# Patient Record
Sex: Female | Born: 1980 | Race: White | Hispanic: No | Smoking: Current every day smoker
Health system: Southern US, Community
[De-identification: ages and names within clinical notes are randomized; demographics above are authoritative.]

## PROBLEM LIST (undated history)

## (undated) DIAGNOSIS — F419 Anxiety disorder, unspecified: Secondary | ICD-10-CM

## (undated) DIAGNOSIS — G8929 Other chronic pain: Secondary | ICD-10-CM

## (undated) DIAGNOSIS — F1110 Opioid abuse, uncomplicated: Secondary | ICD-10-CM

## (undated) DIAGNOSIS — B1920 Unspecified viral hepatitis C without hepatic coma: Secondary | ICD-10-CM

## (undated) DIAGNOSIS — M549 Dorsalgia, unspecified: Secondary | ICD-10-CM

## (undated) HISTORY — PX: TUBAL LIGATION: SHX77

## (undated) HISTORY — PX: TONSILLECTOMY: SUR1361

## (undated) HISTORY — PX: FOOT SURGERY: SHX648

---

## 2000-02-14 ENCOUNTER — Emergency Department (HOSPITAL_COMMUNITY): Admission: EM | Admit: 2000-02-14 | Discharge: 2000-02-14 | Payer: Self-pay | Admitting: Emergency Medicine

## 2007-12-05 ENCOUNTER — Emergency Department (HOSPITAL_COMMUNITY): Admission: EM | Admit: 2007-12-05 | Discharge: 2007-12-05 | Payer: Self-pay | Admitting: Emergency Medicine

## 2007-12-28 ENCOUNTER — Emergency Department (HOSPITAL_COMMUNITY): Admission: EM | Admit: 2007-12-28 | Discharge: 2007-12-28 | Payer: Self-pay | Admitting: Emergency Medicine

## 2008-03-24 ENCOUNTER — Emergency Department (HOSPITAL_COMMUNITY): Admission: EM | Admit: 2008-03-24 | Discharge: 2008-03-24 | Payer: Self-pay | Admitting: Emergency Medicine

## 2008-03-30 ENCOUNTER — Emergency Department (HOSPITAL_COMMUNITY): Admission: EM | Admit: 2008-03-30 | Discharge: 2008-03-30 | Payer: Self-pay | Admitting: Emergency Medicine

## 2008-05-26 ENCOUNTER — Emergency Department (HOSPITAL_COMMUNITY): Admission: EM | Admit: 2008-05-26 | Discharge: 2008-05-26 | Payer: Self-pay | Admitting: Emergency Medicine

## 2008-05-31 ENCOUNTER — Emergency Department (HOSPITAL_COMMUNITY): Admission: EM | Admit: 2008-05-31 | Discharge: 2008-05-31 | Payer: Self-pay | Admitting: Emergency Medicine

## 2008-06-02 ENCOUNTER — Emergency Department (HOSPITAL_COMMUNITY): Admission: EM | Admit: 2008-06-02 | Discharge: 2008-06-02 | Payer: Self-pay | Admitting: Emergency Medicine

## 2008-07-24 ENCOUNTER — Emergency Department (HOSPITAL_COMMUNITY): Admission: EM | Admit: 2008-07-24 | Discharge: 2008-07-24 | Payer: Self-pay | Admitting: Emergency Medicine

## 2008-11-16 ENCOUNTER — Emergency Department (HOSPITAL_COMMUNITY): Admission: EM | Admit: 2008-11-16 | Discharge: 2008-11-16 | Payer: Self-pay | Admitting: Emergency Medicine

## 2009-03-10 ENCOUNTER — Ambulatory Visit: Payer: Self-pay | Admitting: Psychiatry

## 2009-03-10 ENCOUNTER — Inpatient Hospital Stay (HOSPITAL_COMMUNITY): Admission: AD | Admit: 2009-03-10 | Discharge: 2009-03-14 | Payer: Self-pay | Admitting: Psychiatry

## 2009-06-05 ENCOUNTER — Emergency Department (HOSPITAL_COMMUNITY): Admission: EM | Admit: 2009-06-05 | Discharge: 2009-06-06 | Payer: Self-pay | Admitting: Emergency Medicine

## 2009-12-24 ENCOUNTER — Emergency Department (HOSPITAL_BASED_OUTPATIENT_CLINIC_OR_DEPARTMENT_OTHER): Admission: EM | Admit: 2009-12-24 | Discharge: 2009-12-24 | Payer: Self-pay | Admitting: Emergency Medicine

## 2010-06-12 ENCOUNTER — Emergency Department (HOSPITAL_BASED_OUTPATIENT_CLINIC_OR_DEPARTMENT_OTHER): Admission: EM | Admit: 2010-06-12 | Discharge: 2010-06-12 | Payer: Self-pay | Admitting: Emergency Medicine

## 2010-08-12 ENCOUNTER — Emergency Department (HOSPITAL_BASED_OUTPATIENT_CLINIC_OR_DEPARTMENT_OTHER): Admission: EM | Admit: 2010-08-12 | Discharge: 2010-08-12 | Payer: Self-pay | Admitting: Emergency Medicine

## 2010-08-26 ENCOUNTER — Emergency Department (HOSPITAL_BASED_OUTPATIENT_CLINIC_OR_DEPARTMENT_OTHER): Admission: EM | Admit: 2010-08-26 | Discharge: 2010-08-26 | Payer: Self-pay | Admitting: Emergency Medicine

## 2010-10-08 ENCOUNTER — Emergency Department (HOSPITAL_BASED_OUTPATIENT_CLINIC_OR_DEPARTMENT_OTHER): Admission: EM | Admit: 2010-10-08 | Discharge: 2010-10-08 | Payer: Self-pay | Admitting: Emergency Medicine

## 2010-10-13 ENCOUNTER — Emergency Department (HOSPITAL_BASED_OUTPATIENT_CLINIC_OR_DEPARTMENT_OTHER): Admission: EM | Admit: 2010-10-13 | Discharge: 2010-10-13 | Payer: Self-pay | Admitting: Emergency Medicine

## 2010-10-14 ENCOUNTER — Emergency Department (HOSPITAL_BASED_OUTPATIENT_CLINIC_OR_DEPARTMENT_OTHER): Admission: EM | Admit: 2010-10-14 | Discharge: 2010-10-14 | Payer: Self-pay | Admitting: Emergency Medicine

## 2010-11-19 ENCOUNTER — Emergency Department (HOSPITAL_BASED_OUTPATIENT_CLINIC_OR_DEPARTMENT_OTHER)
Admission: EM | Admit: 2010-11-19 | Discharge: 2010-11-19 | Payer: Self-pay | Source: Home / Self Care | Admitting: Emergency Medicine

## 2010-11-23 ENCOUNTER — Emergency Department (HOSPITAL_BASED_OUTPATIENT_CLINIC_OR_DEPARTMENT_OTHER)
Admission: EM | Admit: 2010-11-23 | Discharge: 2010-11-23 | Payer: Self-pay | Source: Home / Self Care | Admitting: Emergency Medicine

## 2010-11-26 ENCOUNTER — Emergency Department (HOSPITAL_BASED_OUTPATIENT_CLINIC_OR_DEPARTMENT_OTHER)
Admission: EM | Admit: 2010-11-26 | Discharge: 2010-11-26 | Payer: Self-pay | Source: Home / Self Care | Admitting: Emergency Medicine

## 2010-11-27 LAB — WOUND CULTURE: Gram Stain: NONE SEEN

## 2010-12-02 ENCOUNTER — Emergency Department (HOSPITAL_BASED_OUTPATIENT_CLINIC_OR_DEPARTMENT_OTHER)
Admission: EM | Admit: 2010-12-02 | Discharge: 2010-12-02 | Payer: Self-pay | Source: Home / Self Care | Admitting: Emergency Medicine

## 2011-01-27 ENCOUNTER — Emergency Department (HOSPITAL_BASED_OUTPATIENT_CLINIC_OR_DEPARTMENT_OTHER)
Admission: EM | Admit: 2011-01-27 | Discharge: 2011-01-27 | Disposition: A | Discharge status: Home / Self Care | Payer: Self-pay | Attending: Emergency Medicine | Admitting: Emergency Medicine

## 2011-01-27 ENCOUNTER — Emergency Department (INDEPENDENT_AMBULATORY_CARE_PROVIDER_SITE_OTHER): Payer: Self-pay

## 2011-01-27 DIAGNOSIS — N390 Urinary tract infection, site not specified: Secondary | ICD-10-CM | POA: Insufficient documentation

## 2011-01-27 DIAGNOSIS — R109 Unspecified abdominal pain: Secondary | ICD-10-CM

## 2011-01-27 DIAGNOSIS — R300 Dysuria: Secondary | ICD-10-CM | POA: Insufficient documentation

## 2011-01-27 LAB — URINALYSIS, ROUTINE W REFLEX MICROSCOPIC
Bilirubin Urine: NEGATIVE
Glucose, UA: NEGATIVE mg/dL
Ketones, ur: NEGATIVE mg/dL
Leukocytes, UA: NEGATIVE
Nitrite: NEGATIVE
Protein, ur: NEGATIVE mg/dL
Specific Gravity, Urine: 1.012 (ref 1.005–1.030)
Urobilinogen, UA: 0.2 mg/dL (ref 0.0–1.0)
pH: 6 (ref 5.0–8.0)

## 2011-01-27 LAB — URINE MICROSCOPIC-ADD ON

## 2011-01-27 LAB — PREGNANCY, URINE: Preg Test, Ur: NEGATIVE

## 2011-02-18 LAB — POCT I-STAT, CHEM 8
BUN: 8 mg/dL (ref 6–23)
Calcium, Ion: 1.13 mmol/L (ref 1.12–1.32)
Chloride: 101 mEq/L (ref 96–112)
Creatinine, Ser: 0.8 mg/dL (ref 0.4–1.2)
Glucose, Bld: 91 mg/dL (ref 70–99)
HCT: 42 % (ref 36.0–46.0)
Hemoglobin: 14.3 g/dL (ref 12.0–15.0)
Potassium: 4.2 mEq/L (ref 3.5–5.1)
Sodium: 140 mEq/L (ref 135–145)
TCO2: 30 mmol/L (ref 0–100)

## 2011-02-18 LAB — RAPID URINE DRUG SCREEN, HOSP PERFORMED
Amphetamines: NOT DETECTED
Barbiturates: NOT DETECTED
Benzodiazepines: POSITIVE — AB
Cocaine: NOT DETECTED
Opiates: POSITIVE — AB
Tetrahydrocannabinol: POSITIVE — AB

## 2011-02-18 LAB — ETHANOL: Alcohol, Ethyl (B): <5 mg/dL (ref 0–10)

## 2011-02-25 ENCOUNTER — Emergency Department (HOSPITAL_BASED_OUTPATIENT_CLINIC_OR_DEPARTMENT_OTHER)
Admission: EM | Admit: 2011-02-25 | Discharge: 2011-02-25 | Discharge status: Against Medical Advice | Payer: Self-pay | Attending: Emergency Medicine | Admitting: Emergency Medicine

## 2011-02-26 LAB — BASIC METABOLIC PANEL
BUN: 10 mg/dL (ref 6–23)
CO2: 28 mEq/L (ref 19–32)
Calcium: 9.4 mg/dL (ref 8.4–10.5)
Chloride: 103 mEq/L (ref 96–112)
Creatinine, Ser: 0.75 mg/dL (ref 0.4–1.2)
GFR calc Af Amer: >60 mL/min (ref >60)
GFR calc non Af Amer: >60 mL/min (ref >60)
Glucose, Bld: 124 mg/dL — ABNORMAL HIGH (ref 70–99)
Potassium: 3.2 mEq/L — ABNORMAL LOW (ref 3.5–5.1)
Sodium: 137 mEq/L (ref 135–145)

## 2011-02-26 LAB — CBC
HCT: 43 % (ref 36.0–46.0)
Hemoglobin: 14.5 g/dL (ref 12.0–15.0)
MCHC: 33.7 g/dL (ref 30.0–36.0)
MCV: 91.1 fL (ref 78.0–100.0)
Platelets: 331 10*3/uL (ref 150–400)
RBC: 4.73 MIL/uL (ref 3.87–5.11)
RDW: 14.3 % (ref 11.5–15.5)
WBC: 8.5 10*3/uL (ref 4.0–10.5)

## 2011-02-26 LAB — DIFFERENTIAL
Basophils Absolute: 0.1 10*3/uL (ref 0.0–0.1)
Basophils Relative: 2 % — ABNORMAL HIGH (ref 0–1)
Eosinophils Absolute: 0.1 10*3/uL (ref 0.0–0.7)
Eosinophils Relative: 2 % (ref 0–5)
Lymphocytes Relative: 35 % (ref 12–46)
Lymphs Abs: 3 10*3/uL (ref 0.7–4.0)
Monocytes Absolute: 0.5 10*3/uL (ref 0.1–1.0)
Monocytes Relative: 7 % (ref 3–12)
Neutro Abs: 4.7 10*3/uL (ref 1.7–7.7)
Neutrophils Relative %: 55 % (ref 43–77)

## 2011-02-26 LAB — RAPID URINE DRUG SCREEN, HOSP PERFORMED
Amphetamines: NOT DETECTED
Barbiturates: POSITIVE — AB
Benzodiazepines: POSITIVE — AB
Cocaine: NOT DETECTED
Opiates: NOT DETECTED
Tetrahydrocannabinol: POSITIVE — AB

## 2011-02-26 LAB — ETHANOL: Alcohol, Ethyl (B): <5 mg/dL (ref 0–10)

## 2011-03-03 ENCOUNTER — Emergency Department (HOSPITAL_BASED_OUTPATIENT_CLINIC_OR_DEPARTMENT_OTHER)
Admission: EM | Admit: 2011-03-03 | Discharge: 2011-03-03 | Disposition: A | Discharge status: Home / Self Care | Payer: Self-pay | Attending: Emergency Medicine | Admitting: Emergency Medicine

## 2011-03-03 ENCOUNTER — Emergency Department (INDEPENDENT_AMBULATORY_CARE_PROVIDER_SITE_OTHER): Payer: Self-pay

## 2011-03-03 DIAGNOSIS — F17200 Nicotine dependence, unspecified, uncomplicated: Secondary | ICD-10-CM | POA: Insufficient documentation

## 2011-03-03 DIAGNOSIS — M545 Low back pain: Secondary | ICD-10-CM

## 2011-03-03 DIAGNOSIS — G8929 Other chronic pain: Secondary | ICD-10-CM | POA: Insufficient documentation

## 2011-03-03 DIAGNOSIS — Z79899 Other long term (current) drug therapy: Secondary | ICD-10-CM | POA: Insufficient documentation

## 2011-03-03 DIAGNOSIS — M549 Dorsalgia, unspecified: Secondary | ICD-10-CM | POA: Insufficient documentation

## 2011-03-03 DIAGNOSIS — W010XXA Fall on same level from slipping, tripping and stumbling without subsequent striking against object, initial encounter: Secondary | ICD-10-CM

## 2011-03-03 DIAGNOSIS — F341 Dysthymic disorder: Secondary | ICD-10-CM | POA: Insufficient documentation
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
## 2011-03-27 NOTE — Discharge Summary (Signed)
Bridget Morgan, Bridget Morgan                ACCOUNT NO.:  [id number]   MEDICAL RECORD NO.:  [id number]          PATIENT TYPE:  IPS   LOCATION:  0305                          [facility]ACILITY:  [facility]   PHYSICIAN:  Jasmine Pang, M.D. DATE OF BIRTH:  1981-06-25   DATE OF ADMISSION:  03/10/2009  DATE OF DISCHARGE:  03/14/2009                               DISCHARGE SUMMARY   IDENTIFYING INFORMATION:  This is a 30 year old single female who was  admitted on a voluntary basis on March 10, 2009.   HISTORY OF PRESENT ILLNESS:  This is the first [facility] admission for this 28-  year-old with a history of substance abuse since age 37.  She presented  to [place] by way of [facility] Emergency Room.  She reports passive  suicidal thoughts and fearing that she cannot get over her addiction.  She states she feels she wants to and is unable to stop using opiates  and benzodiazepines.  She has also been drinking regularly.  She feels  helpless to overcome her addiction and wishes she was dead.  For further  admission information, see psychiatric admission assessment.   PHYSICAL FINDINGS:  Physical exam was done in the [facility]  Emergency Room and is noted in the record.   LABORATORY DATA:  Chemistries were unremarkable.  Liver enzymes normal.  CBC within normal limits.  Alcohol level less than 0.01.  Urine drug  screen positive for benzodiazepines, marijuana, and tricyclics.   HOSPITAL COURSE:  The patient was started on Ambien 5 mg p.o. q.h.s.  p.r.n. insomnia.  She was also started on Robaxin 500 mg p.o. q.i.d.  p.r.n. muscle spasms, and Librium 25 mg p.o. q.4 h. p.r.n. symptoms of  withdrawal and a Nicoderm patch 21 mg daily.  On March 11, 2009, she was  started on Librium detox protocol and clonidine detox protocol.  She was  started on Seroquel 25 mg q.4 h. p.r.n. agitation and Prozac 40 mg  daily, and Ensure q.i.d. p.r.n.  On Mar 12, 2009, the Prozac was  discontinued and she was started on Celexa 20 mg  p.o. q.h.s. and  trazodone 100 mg p.o. q.h.s., may repeat x1 p.r.n.  In individual  sessions, the patient was a well-developed, well-nourished female who  was alert and oriented x4.  She was sad, tearful, and hopeless.  She was  nonpsychotic.  There was no thought disorder.  No active suicidal  ideation.  She stated she wanted help.  She stated she has a sponsor and  knows what to do, just needed to do it.  She was detoxing appropriately  with no symptoms of withdrawal or side effects.  She was asking for  something for her joint irritation.  She had Naproxen along the  clonidine detox protocol and was advised to request this, which she did.  On Mar 14, 2009, her mental status had improved markedly from admission  status.  The patient was still having some trouble sleeping, good  appetite, was improving.  Mood was less depressed, less anxious.  Affect  consistent with mood.  There was no suicidal  or homicidal ideation.  No  thoughts of self-injurious behavior.  No auditory or visual  hallucinations.  No paranoia or delusions.  Thoughts were logical and  goal-directed.  Thought content, no predominant theme.  Cognitive was  grossly intact.  Insight good.  Judgment good.  Impulse control good.  She wanted to go home today and it was felt safe for discharge.  She  plans to see her sponsor and has meeting scheduled.   DISCHARGE DIAGNOSES:  Axis I: Depressive disorder, not otherwise  specified.  Polysubstance abuse.  Axis II: None.  Axis III: Chronic back pain.  Axis IV: Severe (issues with homelessness, parenting, unemployment  issues with social environment, burden of chronic pain, burden of  chemical dependence illness and depression.)  Axis V: Global assessment of functioning was 50 upon discharge.  GAF was  40 upon admission.  GAF highest past year was 55.   DISCHARGE PLANS:  There was no specific activity level or dietary  restriction.   POSTHOSPITAL CARE PLANS:  The patient will go  to [facility] in [place] on  Wednesday Mar 16, 2009, at 9 o'clock a.m.   DISCHARGE MEDICATIONS:  1. Trazodone 100 mg at bedtime.  2. Robaxin as directed by her family doctor.  3. Celexa 20 mg p.o. q.h.s.      Jasmine Pang, M.D.  Electronically Signed     BHS/MEDQ  D:  03/14/2009  T:  03/15/2009  Job:  [id number]

## 2011-04-19 ENCOUNTER — Emergency Department (HOSPITAL_BASED_OUTPATIENT_CLINIC_OR_DEPARTMENT_OTHER)
Admission: EM | Admit: 2011-04-19 | Discharge: 2011-04-19 | Disposition: A | Discharge status: Home / Self Care | Payer: Self-pay | Attending: Emergency Medicine | Admitting: Emergency Medicine

## 2011-04-19 ENCOUNTER — Emergency Department (INDEPENDENT_AMBULATORY_CARE_PROVIDER_SITE_OTHER): Payer: Self-pay

## 2011-04-19 DIAGNOSIS — M25579 Pain in unspecified ankle and joints of unspecified foot: Secondary | ICD-10-CM

## 2011-04-19 DIAGNOSIS — M549 Dorsalgia, unspecified: Secondary | ICD-10-CM | POA: Insufficient documentation

## 2011-04-19 DIAGNOSIS — F17200 Nicotine dependence, unspecified, uncomplicated: Secondary | ICD-10-CM | POA: Insufficient documentation

## 2011-04-19 DIAGNOSIS — M545 Low back pain: Secondary | ICD-10-CM

## 2011-04-19 DIAGNOSIS — G8929 Other chronic pain: Secondary | ICD-10-CM | POA: Insufficient documentation

## 2011-04-24 ENCOUNTER — Emergency Department (HOSPITAL_BASED_OUTPATIENT_CLINIC_OR_DEPARTMENT_OTHER)
Admission: EM | Admit: 2011-04-24 | Discharge: 2011-04-24 | Disposition: A | Discharge status: Home / Self Care | Payer: Self-pay | Attending: Emergency Medicine | Admitting: Emergency Medicine

## 2011-04-24 DIAGNOSIS — M549 Dorsalgia, unspecified: Secondary | ICD-10-CM | POA: Insufficient documentation

## 2011-04-24 DIAGNOSIS — F341 Dysthymic disorder: Secondary | ICD-10-CM | POA: Insufficient documentation

## 2011-04-24 DIAGNOSIS — F17200 Nicotine dependence, unspecified, uncomplicated: Secondary | ICD-10-CM | POA: Insufficient documentation

## 2011-04-24 DIAGNOSIS — G8929 Other chronic pain: Secondary | ICD-10-CM | POA: Insufficient documentation

## 2011-05-15 ENCOUNTER — Ambulatory Visit
Admission: RE | Admit: 2011-05-15 | Discharge: 2011-05-15 | Disposition: A | Discharge status: Home / Self Care | Payer: Self-pay | Source: Ambulatory Visit | Attending: Emergency Medicine | Admitting: Emergency Medicine

## 2011-05-15 ENCOUNTER — Other Ambulatory Visit: Payer: Self-pay | Admitting: Emergency Medicine

## 2011-05-15 ENCOUNTER — Emergency Department (HOSPITAL_BASED_OUTPATIENT_CLINIC_OR_DEPARTMENT_OTHER)
Admission: EM | Admit: 2011-05-15 | Discharge: 2011-05-15 | Disposition: A | Discharge status: Home / Self Care | Payer: Self-pay | Attending: Emergency Medicine | Admitting: Emergency Medicine

## 2011-05-15 DIAGNOSIS — N644 Mastodynia: Secondary | ICD-10-CM | POA: Insufficient documentation

## 2011-05-15 DIAGNOSIS — Z79899 Other long term (current) drug therapy: Secondary | ICD-10-CM | POA: Insufficient documentation

## 2011-05-15 DIAGNOSIS — G8929 Other chronic pain: Secondary | ICD-10-CM | POA: Insufficient documentation

## 2011-05-15 DIAGNOSIS — N6452 Nipple discharge: Secondary | ICD-10-CM

## 2011-05-15 DIAGNOSIS — H7090 Unspecified mastoiditis, unspecified ear: Secondary | ICD-10-CM | POA: Insufficient documentation

## 2011-05-15 LAB — PREGNANCY, URINE: Preg Test, Ur: NEGATIVE

## 2011-05-18 LAB — WOUND CULTURE
Culture: NO GROWTH
Gram Stain: NONE SEEN

## 2011-08-03 LAB — URINALYSIS, ROUTINE W REFLEX MICROSCOPIC
Bilirubin Urine: NEGATIVE
Glucose, UA: NEGATIVE
Hgb urine dipstick: NEGATIVE
Ketones, ur: NEGATIVE
Nitrite: NEGATIVE
Protein, ur: NEGATIVE
Specific Gravity, Urine: 1.025
Urobilinogen, UA: 0.2
pH: 6

## 2011-08-03 LAB — PREGNANCY, URINE: Preg Test, Ur: NEGATIVE

## 2011-08-10 LAB — URINALYSIS, ROUTINE W REFLEX MICROSCOPIC
Bilirubin Urine: NEGATIVE
Bilirubin Urine: NEGATIVE
Glucose, UA: NEGATIVE
Glucose, UA: NEGATIVE
Hgb urine dipstick: NEGATIVE
Hgb urine dipstick: NEGATIVE
Ketones, ur: NEGATIVE
Ketones, ur: NEGATIVE
Nitrite: NEGATIVE
Nitrite: NEGATIVE
Protein, ur: NEGATIVE
Protein, ur: NEGATIVE
Specific Gravity, Urine: 1.015
Specific Gravity, Urine: 1.015
Urobilinogen, UA: 0.2
Urobilinogen, UA: 0.2
pH: 5
pH: 6

## 2011-08-10 LAB — PREGNANCY, URINE: Preg Test, Ur: NEGATIVE

## 2011-11-30 ENCOUNTER — Encounter (HOSPITAL_BASED_OUTPATIENT_CLINIC_OR_DEPARTMENT_OTHER): Payer: Self-pay | Admitting: *Deleted

## 2011-11-30 ENCOUNTER — Emergency Department (HOSPITAL_BASED_OUTPATIENT_CLINIC_OR_DEPARTMENT_OTHER)
Admission: EM | Admit: 2011-11-30 | Discharge: 2011-11-30 | Disposition: A | Discharge status: Home / Self Care | Payer: Self-pay | Attending: Emergency Medicine | Admitting: Emergency Medicine

## 2011-11-30 DIAGNOSIS — Z79899 Other long term (current) drug therapy: Secondary | ICD-10-CM | POA: Insufficient documentation

## 2011-11-30 DIAGNOSIS — M25519 Pain in unspecified shoulder: Secondary | ICD-10-CM | POA: Insufficient documentation

## 2011-11-30 DIAGNOSIS — G8929 Other chronic pain: Secondary | ICD-10-CM | POA: Insufficient documentation

## 2011-11-30 DIAGNOSIS — F17200 Nicotine dependence, unspecified, uncomplicated: Secondary | ICD-10-CM | POA: Insufficient documentation

## 2011-11-30 HISTORY — DX: Dorsalgia, unspecified: M54.9

## 2011-11-30 HISTORY — DX: Anxiety disorder, unspecified: F41.9

## 2011-11-30 HISTORY — DX: Other chronic pain: G89.29

## 2011-11-30 MED ORDER — CARISOPRODOL 350 MG PO TABS: 350.0000 mg | ORAL_TABLET | Freq: Three times a day (TID) | ORAL | Status: AC

## 2011-11-30 MED ORDER — KETOROLAC TROMETHAMINE 60 MG/2ML IM SOLN
60.0000 mg | Freq: Once | INTRAMUSCULAR | Status: AC
  Administered 2011-11-30: 60 mg via INTRAMUSCULAR
  Filled 2011-11-30: qty 2

## 2011-11-30 NOTE — ED Notes (Signed)
Right shoulder pain. No injury.

## 2011-11-30 NOTE — ED Provider Notes (Signed)
History     CSN: [id number]  Arrival date & time 11/30/11  1555   First MD Initiated Contact with Patient 11/30/11 1610      Chief Complaint  Patient presents with  . Shoulder Pain    (Consider location/radiation/quality/duration/timing/severity/associated sxs/prior treatment) HPI Patient with long history of neck and back pain- states worse now for several days.  She states her pmd, Dr. Ethelene Hal, wants her to go to pain clinic.  She states she couldn't afford to go due to lack of insurance.  Patient has been told her problems stem from her scoliosis.  Pain is in right trapezius and goes down right side of back.  Patient states pain sometimes controlled by hydrocodone, denies having had pt.  No recent injury although may have slept on it wrong.  Pain is achy sharp and agony increases with movement and is burning.   Past Medical History  Diagnosis Date  . Chronic back pain   . Anxiety     History reviewed. No pertinent past surgical history.  No family history on file.  History  Substance Use Topics  . Smoking status: Current Everyday Smoker -- 1.0 packs/day  . Smokeless tobacco: Not on file  . Alcohol Use: No    OB History    Grav Para Term Preterm Abortions TAB SAB Ect Mult Living                  Review of Systems  All other systems reviewed and are negative.    Allergies  Dilaudid and Aspirin  Home Medications   Current Outpatient Rx  Name Route Sig Dispense Refill  . VITAMIN D 1000 UNITS PO TABS Oral Take 1,000 Units by mouth daily.    . DULOXETINE HCL 30 MG PO CPEP Oral Take 30 mg by mouth daily.    Marland Kitchen FLUOXETINE HCL 40 MG PO CAPS Oral Take 40 mg by mouth daily.    Marland Kitchen HYDROCODONE-ACETAMINOPHEN 7.5-325 MG PO TABS Oral Take 1 tablet by mouth 4 (four) times daily.    . ADULT MULTIVITAMIN W/MINERALS CH Oral Take 1 tablet by mouth daily.    Marland Kitchen VITAMIN C 500 MG PO TABS Oral Take 500 mg by mouth daily.    Marland Kitchen VITAMIN E 400 UNITS PO CAPS Oral Take 400 Units by mouth  daily.      BP 140/72  Pulse 88  Temp(Src) 98.2 F (36.8 C) (Oral)  Resp 20  SpO2 100%  Physical Exam  Nursing note and vitals reviewed. Constitutional: She appears well-developed and well-nourished.  HENT:  Head: Normocephalic and atraumatic.  Right Ear: External ear normal.  Left Ear: External ear normal.  Eyes: Conjunctivae and EOM are normal. Pupils are equal, round, and reactive to light.  Neck: Normal range of motion. Neck supple.  Cardiovascular: Normal rate and regular rhythm.   Pulmonary/Chest: Effort normal and breath sounds normal.  Abdominal: Soft.  Musculoskeletal: Normal range of motion.       Diffuse trapezius tenderness,     ED Course  Procedures (including critical care time)  Labs Reviewed - No data to display No results found.   No diagnosis found.    MDM          Hilario Quarry, MD 11/30/11 [phone]

## 2012-02-26 ENCOUNTER — Emergency Department (HOSPITAL_BASED_OUTPATIENT_CLINIC_OR_DEPARTMENT_OTHER)
Admission: EM | Admit: 2012-02-26 | Discharge: 2012-02-26 | Disposition: A | Discharge status: Home / Self Care | Payer: Self-pay | Attending: Emergency Medicine | Admitting: Emergency Medicine

## 2012-02-26 ENCOUNTER — Encounter (HOSPITAL_BASED_OUTPATIENT_CLINIC_OR_DEPARTMENT_OTHER): Payer: Self-pay | Admitting: *Deleted

## 2012-02-26 DIAGNOSIS — F17200 Nicotine dependence, unspecified, uncomplicated: Secondary | ICD-10-CM | POA: Insufficient documentation

## 2012-02-26 DIAGNOSIS — M549 Dorsalgia, unspecified: Secondary | ICD-10-CM

## 2012-02-26 DIAGNOSIS — G8929 Other chronic pain: Secondary | ICD-10-CM | POA: Insufficient documentation

## 2012-02-26 DIAGNOSIS — Z886 Allergy status to analgesic agent status: Secondary | ICD-10-CM | POA: Insufficient documentation

## 2012-02-26 DIAGNOSIS — F411 Generalized anxiety disorder: Secondary | ICD-10-CM | POA: Insufficient documentation

## 2012-02-26 MED ORDER — CARISOPRODOL 350 MG PO TABS: 350.0000 mg | ORAL_TABLET | Freq: Three times a day (TID) | ORAL | Status: AC

## 2012-02-26 NOTE — Discharge Instructions (Signed)
Back Pain, Adult Low back pain is very common. About 1 in 5 people have back pain.The cause of low back pain is rarely dangerous. The pain often gets better over time.About half of people with a sudden onset of back pain feel better in just 2 weeks. About 8 in 10 people feel better by 6 weeks.  CAUSES Some common causes of back pain include:  Strain of the muscles or ligaments supporting the spine.   Wear and tear (degeneration) of the spinal discs.   Arthritis.   Direct injury to the back.  DIAGNOSIS Most of the time, the direct cause of low back pain is not known.However, back pain can be treated effectively even when the exact cause of the pain is unknown.Answering your caregiver's questions about your overall health and symptoms is one of the most accurate ways to make sure the cause of your pain is not dangerous. If your caregiver needs more information, he or she may order lab work or imaging tests (X-rays or MRIs).However, even if imaging tests show changes in your back, this usually does not require surgery. HOME CARE INSTRUCTIONS For many people, back pain returns.Since low back pain is rarely dangerous, it is often a condition that people can learn to manageon their own.   Remain active. It is stressful on the back to sit or stand in one place. Do not sit, drive, or stand in one place for more than 30 minutes at a time. Take short walks on level surfaces as soon as pain allows.Try to increase the length of time you walk each day.   Do not stay in bed.Resting more than 1 or 2 days can delay your recovery.   Do not avoid exercise or work.Your body is made to move.It is not dangerous to be active, even though your back may hurt.Your back will likely heal faster if you return to being active before your pain is gone.   Pay attention to your body when you bend and lift. Many people have less discomfortwhen lifting if they bend their knees, keep the load close to their  bodies,and avoid twisting. Often, the most comfortable positions are those that put less stress on your recovering back.   Find a comfortable position to sleep. Use a firm mattress and lie on your side with your knees slightly bent. If you lie on your back, put a pillow under your knees.   Only take over-the-counter or prescription medicines as directed by your caregiver. Over-the-counter medicines to reduce pain and inflammation are often the most helpful.Your caregiver may prescribe muscle relaxant drugs.These medicines help dull your pain so you can more quickly return to your normal activities and healthy exercise.   Put ice on the injured area.   Put ice in a plastic bag.   Place a towel between your skin and the bag.   Leave the ice on for 15 to 20 minutes, 3 to 4 times a day for the first 2 to 3 days. After that, ice and heat may be alternated to reduce pain and spasms.   Ask your caregiver about trying back exercises and gentle massage. This may be of some benefit.   Avoid feeling anxious or stressed.Stress increases muscle tension and can worsen back pain.It is important to recognize when you are anxious or stressed and learn ways to manage it.Exercise is a great option.  SEEK MEDICAL CARE IF:  You have pain that is not relieved with rest or medicine.   You have   pain that does not improve in 1 week.   You have new symptoms.   You are generally not feeling well.  SEEK IMMEDIATE MEDICAL CARE IF:   You have pain that radiates from your back into your legs.   You develop new bowel or bladder control problems.   You have unusual weakness or numbness in your arms or legs.   You develop nausea or vomiting.   You develop abdominal pain.   You feel faint.  Document Released: 10/29/2005 Document Revised: 10/18/2011 Document Reviewed: 03/19/2011 Exit[employer] Patient Information 2012 [employer]. 

## 2012-02-26 NOTE — ED Provider Notes (Signed)
History     CSN: [id number]  Arrival date & time 02/26/12  2043   First MD Initiated Contact with Patient 02/26/12 2127      Chief Complaint  Patient presents with  . Back Pain    (Consider location/radiation/quality/duration/timing/severity/associated sxs/prior treatment) Patient is a 31 y.o. female presenting with back pain. The history is provided by the patient. No language interpreter was used.  Back Pain  This is a new problem. The current episode started 12 to 24 hours ago. The problem occurs constantly. The problem has not changed since onset.The pain is associated with lifting heavy objects. The pain is present in the lumbar spine. The quality of the pain is described as stabbing. The pain does not radiate. The symptoms are aggravated by bending and twisting. The pain is the same all the time. Stiffness is present all day. She has tried nothing for the symptoms. The treatment provided moderate relief.  Pt reports she lifted a generator and has back pain.  No fall.  Pt has chronic pain.  (Pt is well known to me. Records reviewed)  I have informed pt I will not give her narcotics.    Past Medical History  Diagnosis Date  . Chronic back pain   . Anxiety     History reviewed. No pertinent past surgical history.  History reviewed. No pertinent family history.  History  Substance Use Topics  . Smoking status: Current Everyday Smoker -- 1.0 packs/day  . Smokeless tobacco: Not on file  . Alcohol Use: No    OB History    Grav Para Term Preterm Abortions TAB SAB Ect Mult Living                  Review of Systems  Musculoskeletal: Positive for back pain.  All other systems reviewed and are negative.    Allergies  Dilaudid and Aspirin  Home Medications   Current Outpatient Rx  Name Route Sig Dispense Refill  . CARISOPRODOL 350 MG PO TABS Oral Take 350 mg by mouth 3 (three) times daily as needed. For muscle spasms    . VITAMIN D 1000 UNITS PO TABS Oral Take 1,000  Units by mouth daily.    Marland Kitchen FLUOXETINE HCL 40 MG PO CAPS Oral Take 40 mg by mouth daily.    Marland Kitchen HYDROCODONE-ACETAMINOPHEN 7.5-325 MG PO TABS Oral Take 1 tablet by mouth 4 (four) times daily as needed.     . ADULT MULTIVITAMIN W/MINERALS CH Oral Take 1 tablet by mouth daily.    Marland Kitchen OVER THE COUNTER MEDICATION Oral Take 2 tablets by mouth daily as needed. Cold and sinus medication    . VITAMIN C 500 MG PO TABS Oral Take 500 mg by mouth daily.    Marland Kitchen VITAMIN E 400 UNITS PO CAPS Oral Take 400 Units by mouth daily.    Marland Kitchen CARISOPRODOL 350 MG PO TABS Oral Take 1 tablet (350 mg total) by mouth 3 (three) times daily. 15 tablet 0    BP 140/88  Pulse 93  Temp(Src) 97.8 F (36.6 C) (Oral)  Resp 18  SpO2 100%  LMP 02/25/2012  Physical Exam  Nursing note and vitals reviewed. Constitutional: She appears well-developed.  HENT:  Head: Normocephalic.  Eyes: Conjunctivae are normal. Pupils are equal, round, and reactive to light.  Neck: Normal range of motion.  Cardiovascular: Normal rate.   Pulmonary/Chest: Effort normal.  Abdominal: Soft.  Musculoskeletal: Normal range of motion.  Neurological: She is alert.  Skin: Skin is warm.  ED Course  Procedures (including critical care time)  Labs Reviewed - No data to display No results found.   No diagnosis found.    MDM  I advised pt she must see her primary MD for any narcotics.  Charge nurse present during history and physical.         Elson Areas, [place] 02/26/12 2150
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
## 2012-02-26 NOTE — ED Notes (Signed)
Pt c/o of back pain in her lower back.  Reports that she has chronic back pain and is trying to get disability for it.  The back pain got worse after she lifted the a generator a few times.  She states that she know she's not suppose to lift heavy things but sometimes she "forgets." Pt can move all extremities and no visible deformities present.

## 2012-02-26 NOTE — ED Notes (Signed)
PA at bedside.

## 2012-02-26 NOTE — ED Notes (Signed)
This RN was present during PA's assessment of pt and the explanation given to her regarding her abuse of narcotics and multiple visits to different EDs. Pt offered other forms of treatment for her pain and discussed further plan for treatment.
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
## 2012-02-27 NOTE — ED Provider Notes (Signed)
Medical screening examination/treatment/procedure(s) were performed by non-physician practitioner and as supervising physician I was immediately available for consultation/collaboration.  Amaury Kuzel, MD 02/27/12 2005 

## 2012-06-21 ENCOUNTER — Encounter (HOSPITAL_BASED_OUTPATIENT_CLINIC_OR_DEPARTMENT_OTHER): Payer: Self-pay | Admitting: *Deleted

## 2012-06-21 ENCOUNTER — Emergency Department (HOSPITAL_BASED_OUTPATIENT_CLINIC_OR_DEPARTMENT_OTHER)
Admission: EM | Admit: 2012-06-21 | Discharge: 2012-06-22 | Disposition: A | Discharge status: Home / Self Care | Payer: Self-pay | Attending: Emergency Medicine | Admitting: Emergency Medicine

## 2012-06-21 ENCOUNTER — Emergency Department (HOSPITAL_BASED_OUTPATIENT_CLINIC_OR_DEPARTMENT_OTHER): Payer: Self-pay

## 2012-06-21 DIAGNOSIS — F17200 Nicotine dependence, unspecified, uncomplicated: Secondary | ICD-10-CM | POA: Insufficient documentation

## 2012-06-21 DIAGNOSIS — M25579 Pain in unspecified ankle and joints of unspecified foot: Secondary | ICD-10-CM | POA: Insufficient documentation

## 2012-06-21 DIAGNOSIS — M25572 Pain in left ankle and joints of left foot: Secondary | ICD-10-CM

## 2012-06-21 DIAGNOSIS — G8929 Other chronic pain: Secondary | ICD-10-CM | POA: Insufficient documentation

## 2012-06-21 DIAGNOSIS — M549 Dorsalgia, unspecified: Secondary | ICD-10-CM | POA: Insufficient documentation

## 2012-06-21 DIAGNOSIS — F411 Generalized anxiety disorder: Secondary | ICD-10-CM | POA: Insufficient documentation

## 2012-06-21 MED ORDER — HYDROCODONE-ACETAMINOPHEN 5-325 MG PO TABS
1.0000 | ORAL_TABLET | Freq: Once | ORAL | Status: AC
  Administered 2012-06-21: 1 via ORAL
  Filled 2012-06-21: qty 1

## 2012-06-21 NOTE — ED Notes (Signed)
Pt states that she is supposed to be on 7.5 mgs of hydrocodone  But she can not afford to have her pain meds refilled

## 2012-06-21 NOTE — ED Provider Notes (Signed)
History     CSN: [id number]  Arrival date & time 06/21/12  2009   First MD Initiated Contact with Patient 06/21/12 2143      Chief Complaint  Patient presents with  . Foot Pain    (Consider location/radiation/quality/duration/timing/severity/associated sxs/prior treatment) HPI Comments: Patient reports she was stepping off the porch and her left ankle hyperextended under her.  Reports pain in her ankle and heel with radiation into her lateral lower leg.  Denies hitting head or LOC.  Denies other injury.  Denies weakness or numbness of the foot.    The history is provided by the patient.    Past Medical History  Diagnosis Date  . Chronic back pain   . Anxiety     History reviewed. No pertinent past surgical history.  History reviewed. No pertinent family history.  History  Substance Use Topics  . Smoking status: Current Everyday Smoker -- 1.0 packs/day  . Smokeless tobacco: Not on file  . Alcohol Use: No    OB History    Grav Para Term Preterm Abortions TAB SAB Ect Mult Living                  Review of Systems  Skin: Negative for color change, pallor and wound.  Neurological: Negative for syncope, weakness and numbness.    Allergies  Dilaudid and Aspirin  Home Medications   Current Outpatient Rx  Name Route Sig Dispense Refill  . ACETAMINOPHEN 500 MG PO TABS Oral Take 1,000 mg by mouth every 6 (six) hours as needed. For pain    . FLUOXETINE HCL 20 MG PO CAPS Oral Take 20 mg by mouth daily.    . IBUPROFEN 200 MG PO TABS Oral Take 200 mg by mouth every 6 (six) hours as needed. For pain    . OMEPRAZOLE MAGNESIUM 20 MG PO TBEC Oral Take 20 mg by mouth daily.    Marland Kitchen OVER THE COUNTER MEDICATION Oral Take 2 tablets by mouth daily as needed. Cold and sinus medication    . TRAZODONE HCL 50 MG PO TABS Oral Take 150 mg by mouth at bedtime.      BP 141/90  Pulse 106  Temp 98.1 F (36.7 C) (Oral)  Resp 24  SpO2 100%  Physical Exam  Nursing note and vitals  reviewed. Constitutional: She appears well-developed and well-nourished. No distress.  HENT:  Head: Normocephalic and atraumatic.  Neck: Neck supple.  Pulmonary/Chest: Effort normal.  Musculoskeletal:       Left ankle: She exhibits normal range of motion, no swelling, no ecchymosis, no deformity, no laceration and normal pulse. Achilles tendon normal.       Left foot: Normal.       Feet:       Left ankle with tenderness to palpation.  No edema, skin changes.  Sensation intact.  Dorsalis pedis and posterior tibialis pulses are intact.  Capillary refill < 2 seconds.    Neurological: She is alert.  Skin: She is not diaphoretic.    ED Course  Procedures (including critical care time)  Labs Reviewed - No data to display Dg Ankle Complete Left  06/22/2012  *RADIOLOGY REPORT*  Clinical Data: Twisted ankle  LEFT ANKLE COMPLETE - 3+ VIEW  Comparison: None.  Findings: The ankle mortise intact.  Talar dome is normal.  No malleolar fracture.  Calcaneus is normal.  The screw fixation of the first metatarsal noted.  IMPRESSION: No ankle fracture.  Original Report Authenticated By: Genevive Bi, M.D.  1. Pain in left ankle       MDM  Pt with pain in lower left ankle after stepping awkwardly off the porch.  There is no swelling, no discoloration.  Xrays are negative.  Pt upset that I would not refill her Norco 7.5s she uses for her chronic back pain because she has not been able to see her doctor.  I have explained to her that I will treat her for her ankle pain but that she will need to see her PCP regarding her chronic pain.  Discussed all results with patient. Pt placed in ASO and given crutches. Pt verbalizes understanding. Pt given return precautions.         [place], [place] 06/22/12 2119

## 2012-06-21 NOTE — ED Notes (Signed)
Pt states that she fell off the porch and twisted her left foot states that pain radiates up her leg. Pt initially stated that it was her right foot and then changed to left foot. Pt also states that she has a driver and that he has not left he just stepped out for a min

## 2012-06-21 NOTE — ED Notes (Signed)
Patient offered that she has a driver.  Reports that her step father brought her.

## 2012-06-22 MED ORDER — HYDROCODONE-ACETAMINOPHEN 5-325 MG PO TABS: 1.0000 | ORAL_TABLET | Freq: Four times a day (QID) | ORAL | Status: AC | PRN

## 2012-06-23 NOTE — ED Provider Notes (Signed)
Medical screening examination/treatment/procedure(s) were performed by non-physician practitioner and as supervising physician I was immediately available for consultation/collaboration.   Shardae Kleinman, MD 06/23/12 0112 

## 2012-07-07 IMAGING — CT CT ABD-PELV W/O CM
2 of 4 series · 17 of 46 positions shown, 19 images · non-contrast
Comparison: None.

CLINICAL DATA: Right flank pain

CT ABDOMEN AND PELVIS WITHOUT CONTRAST
TECHNIQUE: Multidetector CT imaging of the abdomen and pelvis was
performed following the standard protocol without intravenous
contrast.

[Series 2: renal stone < 200 lbs 5.0 b31f · axial · 0.69mm/px · z∈[+755,+1205]mm · 14 of 98 slices shown, 16 images]
[im 4/98  soft-tissue]
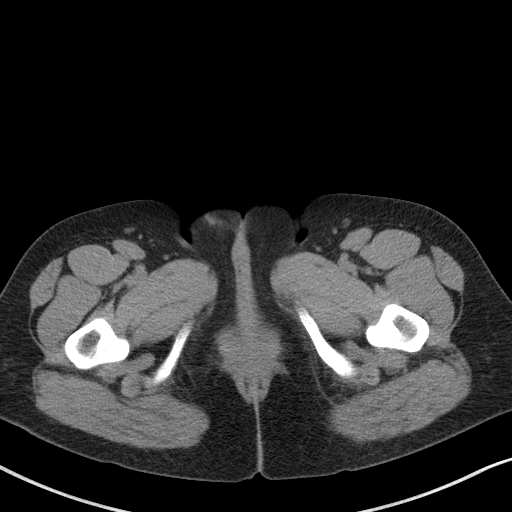
[im 4/98  bone]
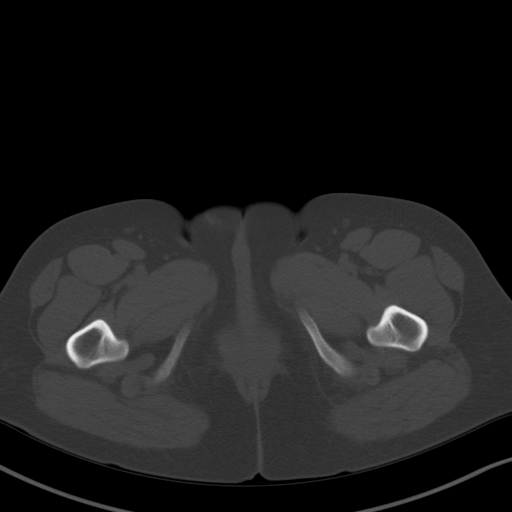
[im 12/98  soft-tissue]
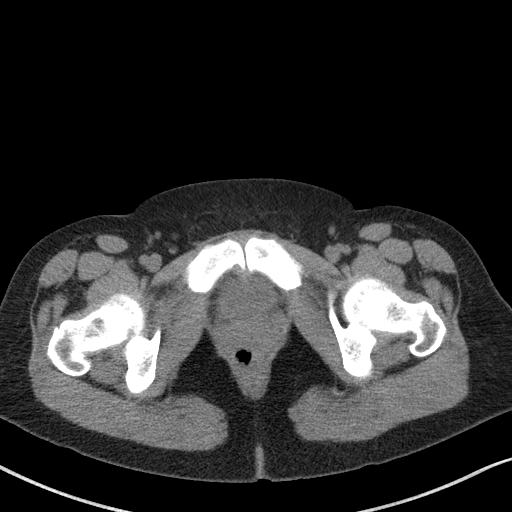
[im 20/98  soft-tissue]
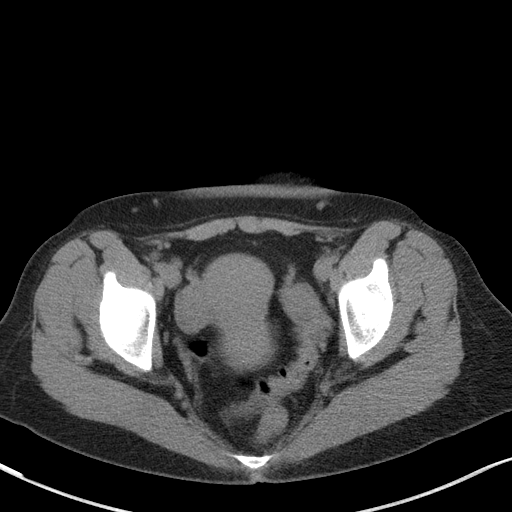
[im 28/98  soft-tissue]
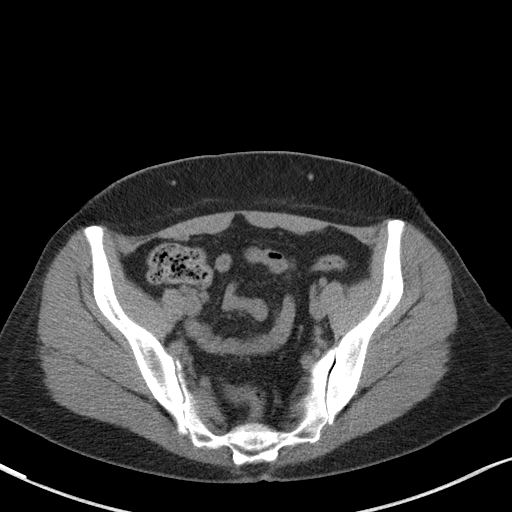
[im 32/98  soft-tissue]
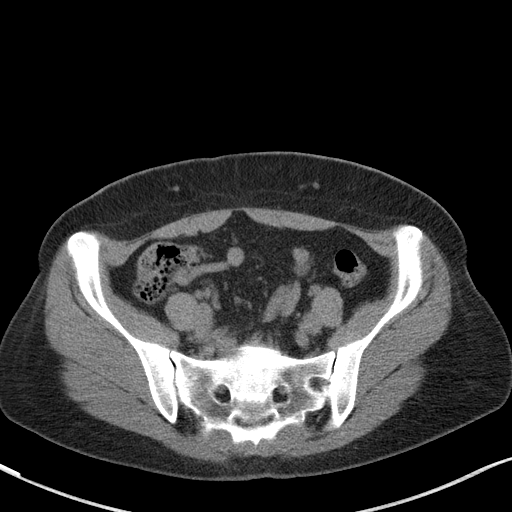
[im 39/98  soft-tissue]
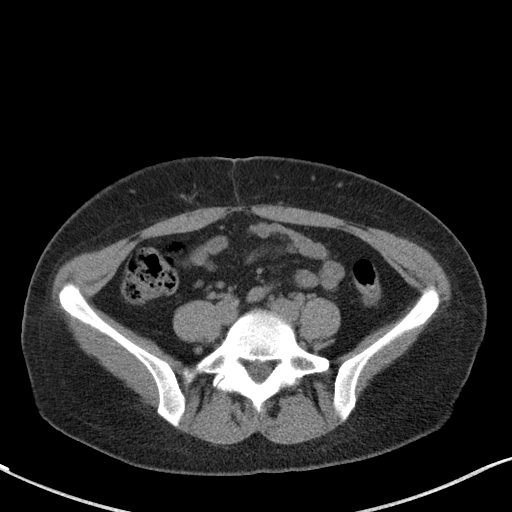
[im 47/98  soft-tissue]
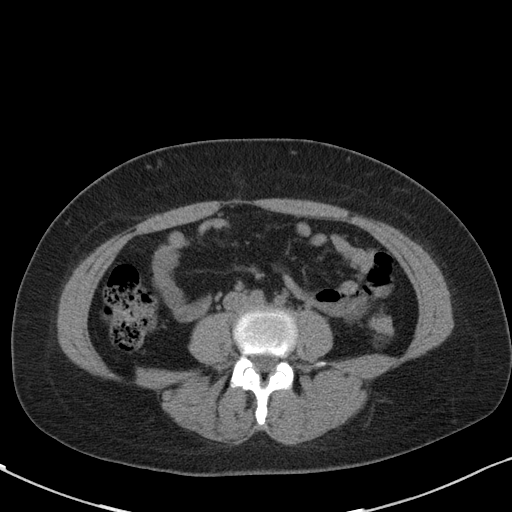
[im 51/98  soft-tissue]
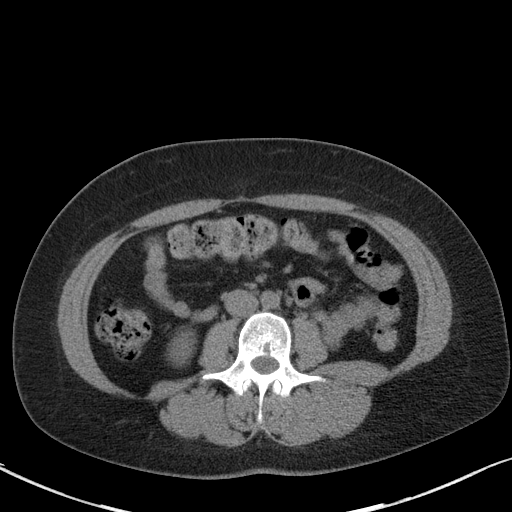
[im 59/98  soft-tissue]
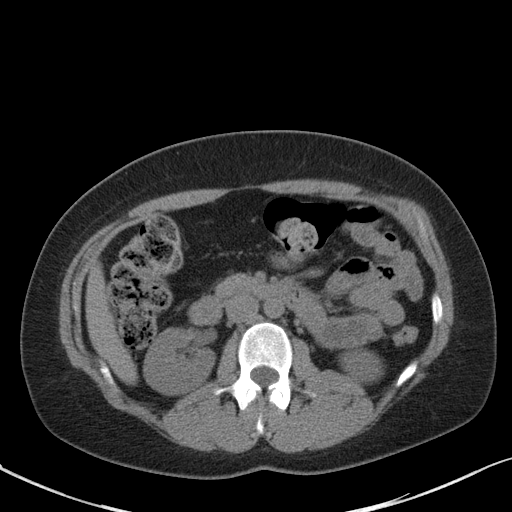
[im 59/98  bone]
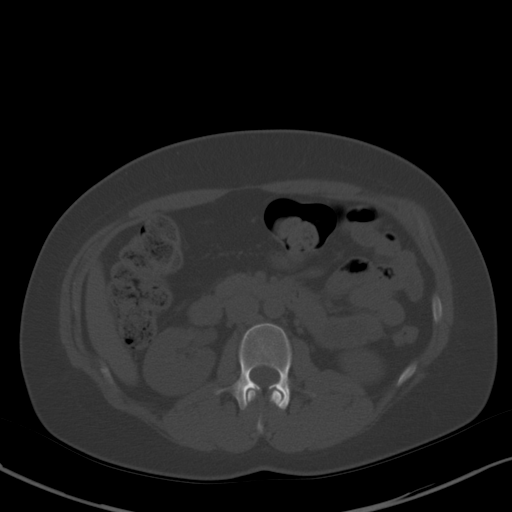
[im 66/98  soft-tissue]
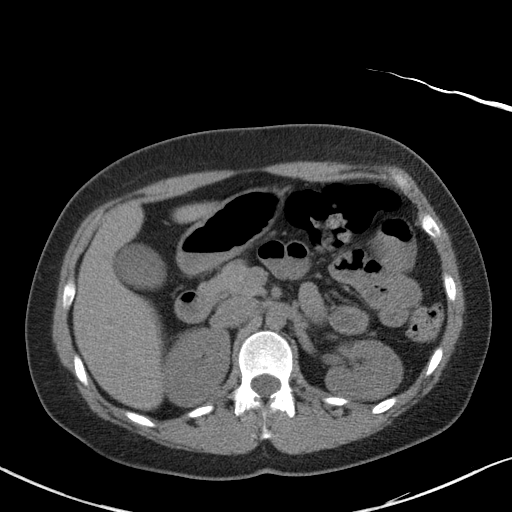
[im 74/98  soft-tissue]
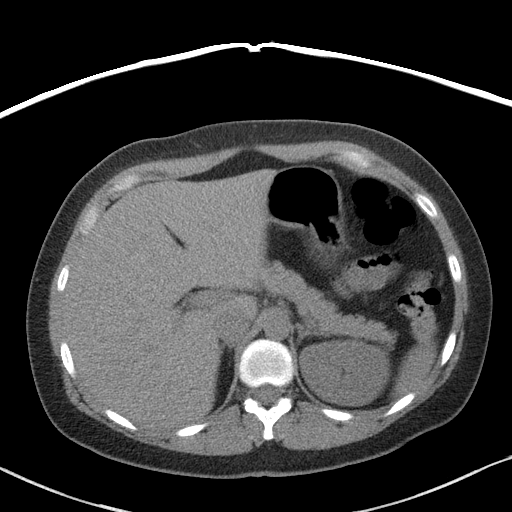
[im 78/98  soft-tissue]
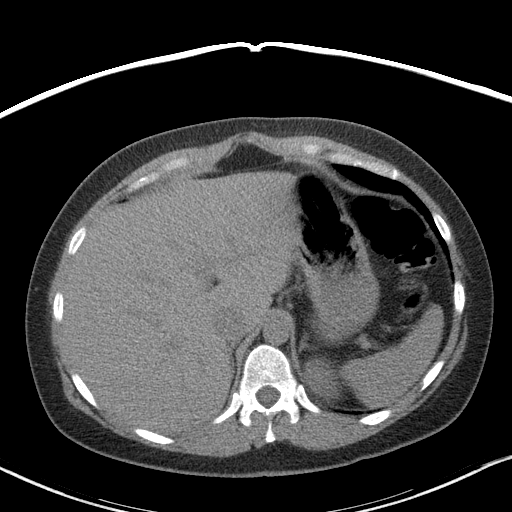
[im 86/98  soft-tissue]
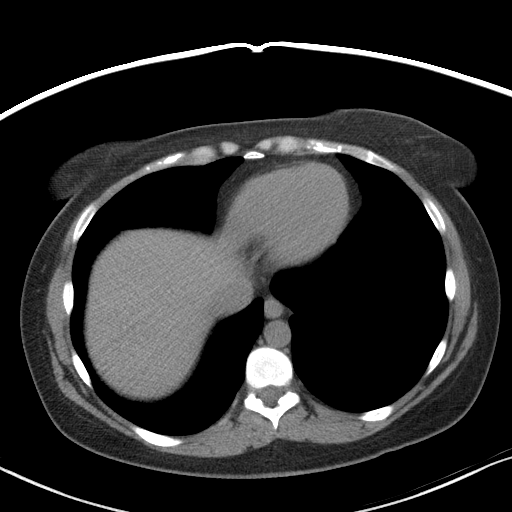
[im 94/98  soft-tissue]
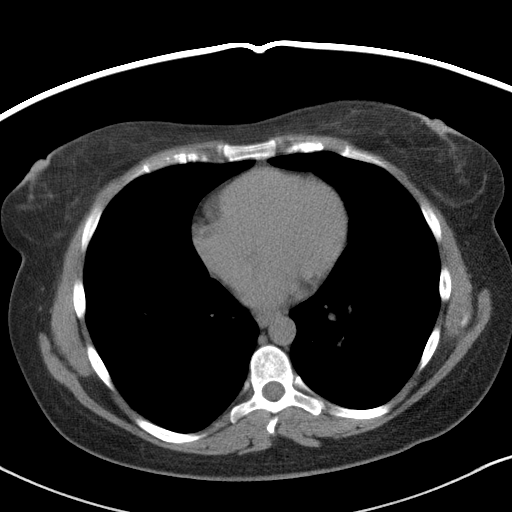

[Series 5: renal stone 3.0 coronal · coronal · 0.85mm/px · 3 of 80 slices shown]
[im 27/80  soft-tissue]
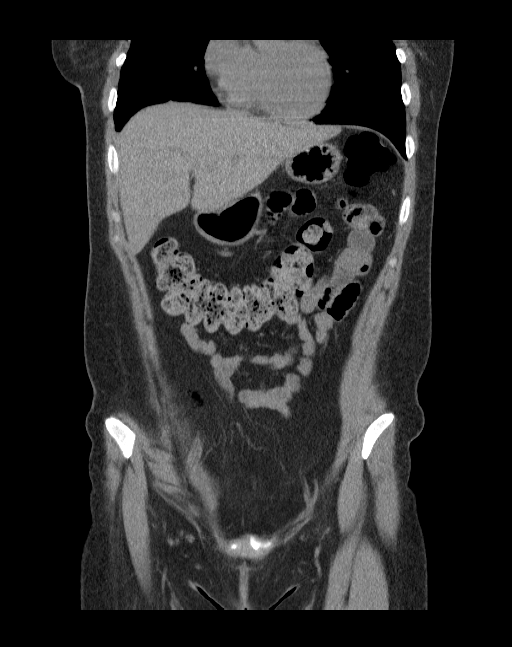
[im 36/80  soft-tissue]
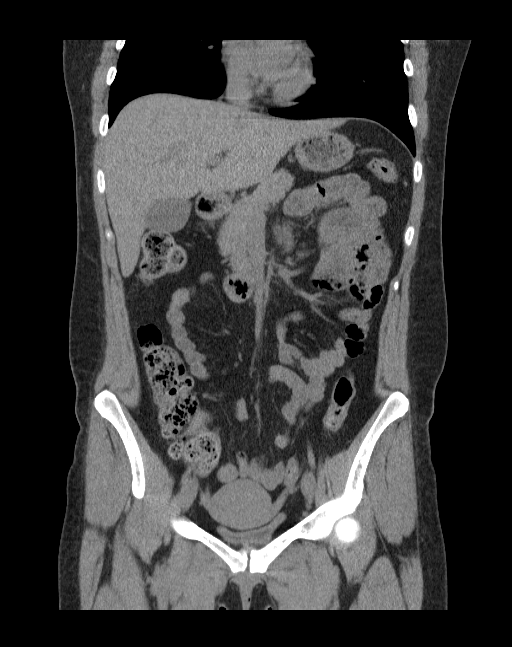
[im 44/80  soft-tissue]
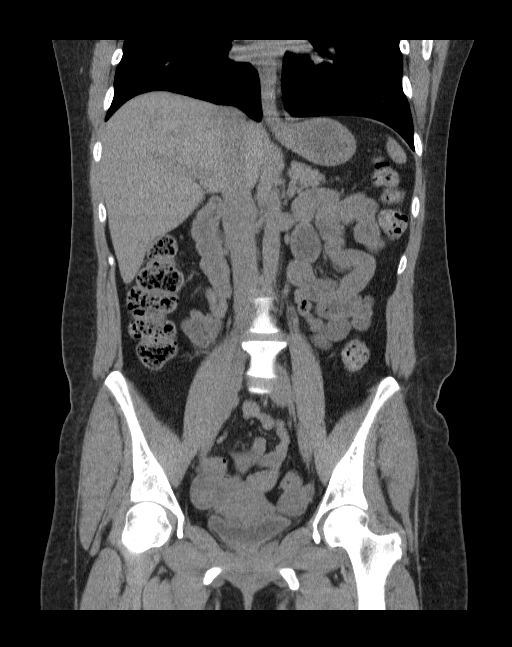

[17 of 46 positions shown; findings below may reference images not displayed]

FINDINGS: Liver, gallbladder, spleen, pancreas, adrenal glands,
kidneys are within normal limits.  The uterus, adnexa, bladder, and
appendix are within normal limits. Mild degenerative disc disease
in the lower lumbar spine.
IMPRESSION: No acute intra-abdominal or pelvic pathology.

## 2013-08-15 ENCOUNTER — Encounter (HOSPITAL_COMMUNITY): Payer: Self-pay | Admitting: *Deleted

## 2013-08-15 ENCOUNTER — Emergency Department (HOSPITAL_COMMUNITY)
Admission: EM | Admit: 2013-08-15 | Discharge: 2013-08-15 | Disposition: A | Discharge status: Home / Self Care | Payer: Self-pay | Attending: Emergency Medicine | Admitting: Emergency Medicine

## 2013-08-15 DIAGNOSIS — F1110 Opioid abuse, uncomplicated: Secondary | ICD-10-CM | POA: Insufficient documentation

## 2013-08-15 DIAGNOSIS — G8929 Other chronic pain: Secondary | ICD-10-CM | POA: Insufficient documentation

## 2013-08-15 DIAGNOSIS — Z79899 Other long term (current) drug therapy: Secondary | ICD-10-CM | POA: Insufficient documentation

## 2013-08-15 DIAGNOSIS — F411 Generalized anxiety disorder: Secondary | ICD-10-CM | POA: Insufficient documentation

## 2013-08-15 DIAGNOSIS — F17200 Nicotine dependence, unspecified, uncomplicated: Secondary | ICD-10-CM | POA: Insufficient documentation

## 2013-08-15 DIAGNOSIS — R4789 Other speech disturbances: Secondary | ICD-10-CM | POA: Insufficient documentation

## 2013-08-15 LAB — CBC
HCT: 45.1 % (ref 36.0–46.0)
Hemoglobin: 16.2 g/dL — ABNORMAL HIGH (ref 12.0–15.0)
MCV: 88.1 fL (ref 78.0–100.0)
RDW: 13 % (ref 11.5–15.5)
WBC: 8.7 10*3/uL (ref 4.0–10.5)

## 2013-08-15 LAB — COMPREHENSIVE METABOLIC PANEL
BUN: 14 mg/dL (ref 6–23)
CO2: 26 mEq/L (ref 19–32)
Chloride: 102 mEq/L (ref 96–112)
Creatinine, Ser: 0.68 mg/dL (ref 0.50–1.10)
GFR calc Af Amer: >90 mL/min (ref >90)
GFR calc non Af Amer: >90 mL/min (ref >90)
Glucose, Bld: 183 mg/dL — ABNORMAL HIGH (ref 70–99)
Total Bilirubin: 0.4 mg/dL (ref 0.3–1.2)

## 2013-08-15 LAB — ETHANOL: Alcohol, Ethyl (B): <11 mg/dL (ref 0–11)

## 2013-08-15 LAB — ACETAMINOPHEN LEVEL: Acetaminophen (Tylenol), Serum: <15 ug/mL (ref 10–30)

## 2013-08-15 MED ORDER — NALOXONE HCL 0.4 MG/ML IJ SOLN
0.4000 mg | Freq: Once | INTRAMUSCULAR | Status: AC
  Administered 2013-08-15: 0.4 mg via INTRAVENOUS
  Filled 2013-08-15: qty 1

## 2013-08-15 NOTE — ED Notes (Signed)
phelbotomy to draw pt's blood.

## 2013-08-15 NOTE — ED Notes (Addendum)
Pt states that she is here for detox from heroin. Pt states that she last used an hour ago. Pt denies SI or HI. Pt was found outside hugging a post. Pt brought in by wheelchair. Pt leaning in wheelchair falling asleep after each question. Pt easily arousable to name/voice.

## 2013-08-15 NOTE — ED Provider Notes (Signed)
CSN: [id number]     Arrival date & time 08/15/13  2119 History   First MD Initiated Contact with Patient 08/15/13 2216     Chief Complaint  Patient presents with  . Medical Clearance   (Consider location/radiation/quality/duration/timing/severity/associated sxs/prior Treatment) Patient is a 32 y.o. female presenting with drug/alcohol assessment. The history is provided by the patient. The history is limited by the condition of the patient (visibly high on heroin).  Drug / Alcohol Assessment Similar prior episodes: yes   Severity:  Moderate Onset quality:  Gradual Timing:  Constant Progression:  Unchanged Chronicity:  Chronic Suspected agents:  Heroin Associated symptoms: somnolence   Associated symptoms: no abdominal pain, no blackouts, no confusion, no loss of consciousness, no nausea, no seizures and no vomiting     Past Medical History  Diagnosis Date  . Chronic back pain   . Anxiety    History reviewed. No pertinent past surgical history. History reviewed. No pertinent family history. History  Substance Use Topics  . Smoking status: Current Every Day Smoker -- 1.00 packs/day  . Smokeless tobacco: Not on file  . Alcohol Use: No   OB History   Grav Para Term Preterm Abortions TAB SAB Ect Mult Living                 Review of Systems  Unable to perform ROS: Mental status change  Gastrointestinal: Negative for nausea, vomiting and abdominal pain.  Neurological: Negative for seizures and loss of consciousness.  Psychiatric/Behavioral: Negative for confusion.    Allergies  Dilaudid and Aspirin  Home Medications   Current Outpatient Rx  Name  Route  Sig  Dispense  Refill  . FLUoxetine (PROZAC) 20 MG capsule   Oral   Take 20 mg by mouth daily.         Marland Kitchen omeprazole (PRILOSEC OTC) 20 MG tablet   Oral   Take 20 mg by mouth daily.         . traZODone (DESYREL) 50 MG tablet   Oral   Take 150 mg by mouth at bedtime.          BP 113/67  Pulse 90   Temp(Src) 98.1 F (36.7 C) (Oral)  Resp 16  Wt 143 lb (64.864 kg)  SpO2 94% Physical Exam  Nursing note and vitals reviewed. Constitutional: She is oriented to person, place, and time. She appears well-developed and well-nourished. No distress.  HENT:  Head: Normocephalic and atraumatic.  Mouth/Throat: Oropharynx is clear and moist. No oropharyngeal exudate.  Eyes: Conjunctivae and EOM are normal. Pupils are equal, round, and reactive to light.  Pinpoint pupils, drooped eye lids and difficulty opening them on her own.  Neck: Normal range of motion. Neck supple.  Cardiovascular: Normal rate, regular rhythm and normal heart sounds.  Exam reveals no gallop and no friction rub.   No murmur heard. Pulmonary/Chest: Effort normal and breath sounds normal. No respiratory distress. She has no wheezes. She has no rales. She exhibits no tenderness.  Abdominal: Soft. She exhibits no distension. There is no tenderness.  Musculoskeletal: Normal range of motion. She exhibits no edema and no tenderness.  Lymphadenopathy:    She has no cervical adenopathy.  Neurological: She is alert and oriented to person, place, and time.  Skin: Skin is warm and dry. No rash noted. She is not diaphoretic.  Psychiatric: Her speech is delayed and slurred. She is slowed. Cognition and memory are impaired. She exhibits a depressed mood. She expresses no homicidal and no  suicidal ideation.    ED Course  Procedures (including critical care time) Labs Review Labs Reviewed  CBC - Abnormal; Notable for the following:    RBC 5.12 (*)    Hemoglobin 16.2 (*)    All other components within normal limits  COMPREHENSIVE METABOLIC PANEL - Abnormal; Notable for the following:    Potassium 3.3 (*)    Glucose, Bld 183 (*)    All other components within normal limits  SALICYLATE LEVEL - Abnormal; Notable for the following:    Salicylate Lvl <2.0 (*)    All other components within normal limits  ETHANOL  ACETAMINOPHEN LEVEL   URINE RAPID DRUG SCREEN (HOSP PERFORMED)   Imaging Review No results found.  MDM   1. 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 She states that she last used approximately one hour prior to arrival and she is requesting detox. She states that she just wants to "get off of it". She otherwise denies fever, chest pain, shortness of breath, abdominal pain, nausea, vomiting, constipation, diarrhea.  On arrival patient is visibly intoxicated. Slurred speech, slowed speech, pinpoint pupils, droopy eyelids. States last used heroin one hour ago. No other signs of infection. Patient was not clinically able to take care of herself upon leaving the emergency department. Patient denies SI, HI. Will allow to metabolize and then re-examine. Clearance labs and urine preg sent.  Mild hypokalemia at 3.3, but otherwise normal labs. Salicylates, tylenol, and ethanol negative. Likely just heroin intoxication without overdose. 0.4 narcan given with resolution of intoxication. Did not get UPT due to patient not urinating, but LMP two weeks ago and patient denies pregnancy. Patient states she has kids and does not want inpatient rehab. States that she wants outpatient resources. Medically clear otherwise. Will provide with outpatient resources and discharge in good condition. Alert and without signs of impairment at discharge. Able to ambulate independently.  Discussed with the patient return precautions and need for follow up with outpatient addiction recovery resources provided. Patient voiced understanding. Stable for d/c. This patient was discussed with my attending, Dr. Redgie Grayer.  Dorna Leitz, MD 08/15/13 [phone]

## 2013-08-15 NOTE — ED Notes (Addendum)
Pt provided with resources for inpatient and outpatient treatment centers. Pt requested to speak with Dr. Tyron Russell, Dr. Tyron Russell at bedside at 1142 to speak with patient. Pt given resource guide and telephone to call for ride. Pt offered bus pass, pt declines bus pass at this time. Pt shown to waiting room, given warm blankets and shown where phone is and instructed on use of phone in waiting room. Pt alert, oriented, ambulatory without issue. Pt's belongings remain with patient.

## 2013-08-15 NOTE — ED Notes (Addendum)
Urine not collected, Dr. Durward Fortes aware states OK for discharge. Pt A&Ox4, ambulatory to waiting room without issue.

## 2013-08-15 NOTE — ED Notes (Signed)
Pt wanded by security. 

## 2013-08-16 ENCOUNTER — Emergency Department (HOSPITAL_COMMUNITY)
Admission: EM | Admit: 2013-08-16 | Discharge: 2013-08-16 | Disposition: A | Discharge status: Other Acute Inpatient Hospital | Payer: Self-pay | Attending: Emergency Medicine | Admitting: Emergency Medicine

## 2013-08-16 ENCOUNTER — Encounter (HOSPITAL_COMMUNITY): Payer: Self-pay | Admitting: Emergency Medicine

## 2013-08-16 DIAGNOSIS — F17200 Nicotine dependence, unspecified, uncomplicated: Secondary | ICD-10-CM | POA: Insufficient documentation

## 2013-08-16 DIAGNOSIS — G8929 Other chronic pain: Secondary | ICD-10-CM | POA: Insufficient documentation

## 2013-08-16 DIAGNOSIS — F1120 Opioid dependence, uncomplicated: Secondary | ICD-10-CM | POA: Insufficient documentation

## 2013-08-16 DIAGNOSIS — F411 Generalized anxiety disorder: Secondary | ICD-10-CM | POA: Insufficient documentation

## 2013-08-16 DIAGNOSIS — Z3202 Encounter for pregnancy test, result negative: Secondary | ICD-10-CM | POA: Insufficient documentation

## 2013-08-16 DIAGNOSIS — Z79899 Other long term (current) drug therapy: Secondary | ICD-10-CM | POA: Insufficient documentation

## 2013-08-16 DIAGNOSIS — Z008 Encounter for other general examination: Secondary | ICD-10-CM | POA: Insufficient documentation

## 2013-08-16 DIAGNOSIS — F1110 Opioid abuse, uncomplicated: Secondary | ICD-10-CM

## 2013-08-16 LAB — RAPID URINE DRUG SCREEN, HOSP PERFORMED
Amphetamines: NOT DETECTED
Tetrahydrocannabinol: NOT DETECTED

## 2013-08-16 LAB — POCT PREGNANCY, URINE: Preg Test, Ur: NEGATIVE

## 2013-08-16 MED ORDER — CLONIDINE HCL 0.1 MG PO TABS: 0.1000 mg | ORAL_TABLET | ORAL | Status: DC

## 2013-08-16 MED ORDER — CLONIDINE HCL 0.1 MG PO TABS: 0.1000 mg | ORAL_TABLET | Freq: Every day | ORAL | Status: DC

## 2013-08-16 MED ORDER — ONDANSETRON HCL 4 MG PO TABS
4.0000 mg | ORAL_TABLET | Freq: Three times a day (TID) | ORAL | Status: DC | PRN
  Administered 2013-08-16: 4 mg via ORAL
  Filled 2013-08-16: qty 1

## 2013-08-16 MED ORDER — ONDANSETRON 4 MG PO TBDP
4.0000 mg | ORAL_TABLET | Freq: Four times a day (QID) | ORAL | Status: DC | PRN
  Administered 2013-08-16: 4 mg via ORAL
  Filled 2013-08-16: qty 1

## 2013-08-16 MED ORDER — DICYCLOMINE HCL 20 MG PO TABS: 20.0000 mg | ORAL_TABLET | Freq: Four times a day (QID) | ORAL | Status: DC | PRN

## 2013-08-16 MED ORDER — LOPERAMIDE HCL 2 MG PO CAPS: 2.0000 mg | ORAL_CAPSULE | ORAL | Status: DC | PRN

## 2013-08-16 MED ORDER — CLONIDINE HCL 0.1 MG PO TABS
0.1000 mg | ORAL_TABLET | Freq: Four times a day (QID) | ORAL | Status: DC
  Administered 2013-08-16: 0.1 mg via ORAL
  Filled 2013-08-16: qty 1

## 2013-08-16 MED ORDER — METHOCARBAMOL 500 MG PO TABS: 500.0000 mg | ORAL_TABLET | Freq: Three times a day (TID) | ORAL | Status: DC | PRN

## 2013-08-16 MED ORDER — HYDROXYZINE HCL 25 MG PO TABS: 25.0000 mg | ORAL_TABLET | Freq: Four times a day (QID) | ORAL | Status: DC | PRN

## 2013-08-16 MED ORDER — NAPROXEN 250 MG PO TABS: 500.0000 mg | ORAL_TABLET | Freq: Two times a day (BID) | ORAL | Status: DC | PRN

## 2013-08-16 NOTE — ED Provider Notes (Signed)
CSN: [id number]     Arrival date & time 08/16/13  0007 History   First MD Initiated Contact with Patient 08/16/13 [phone]     Chief Complaint  Patient presents with  . Medical Clearance   (Consider location/radiation/quality/duration/timing/severity/associated sxs/prior Treatment) HPI Patient was seen earlier in the day wanting detox from heroin. She is given outpatient resources. She checked back in saying that she does not think that she can get outpatient treatment because of her transportation issues. She would like evaluation for inpatient treatment. She admits to daily heroin use. She denies any hallucinations, suicidal ideation, homicidal ideation.  Past Medical History  Diagnosis Date  . Chronic back pain   . Anxiety    History reviewed. No pertinent past surgical history. No family history on file. History  Substance Use Topics  . Smoking status: Current Every Day Smoker -- 1.00 packs/day  . Smokeless tobacco: Not on file  . Alcohol Use: No   OB History   Grav Para Term Preterm Abortions TAB SAB Ect Mult Living                 Review of Systems  Constitutional: Negative for fever and chills.  HENT: Negative for neck pain.   Respiratory: Negative for shortness of breath.   Cardiovascular: Negative for chest pain.  Gastrointestinal: Negative for nausea, vomiting and abdominal pain.  Musculoskeletal: Negative for myalgias and back pain.  Skin: Negative for rash and wound.  Neurological: Negative for dizziness, weakness and numbness.  All other systems reviewed and are negative.    Allergies  Dilaudid and Aspirin  Home Medications   Current Outpatient Rx  Name  Route  Sig  Dispense  Refill  . ARIPiprazole (ABILIFY) 10 MG tablet   Oral   Take 10 mg by mouth daily.         Marland Kitchen HYDROcodone-acetaminophen (NORCO) 10-325 MG per tablet   Oral   Take 1 tablet by mouth every 6 (six) hours.         . Pseudoephedrine-Acetaminophen (SINUS PO)   Oral   Take 1 tablet  by mouth daily as needed (for congestion).         . carisoprodol (SOMA) 350 MG tablet   Oral   Take 350 mg by mouth 3 (three) times daily.          BP 113/72  Pulse 76  Temp(Src) 97 F (36.1 C) (Oral)  Resp 14  Ht 5\' 2"  (1.575 m)  Wt 143 lb (64.864 kg)  BMI 26.15 kg/m2  SpO2 94%  LMP 07/17/2013 Physical Exam  Nursing note and vitals reviewed. Constitutional: She is oriented to person, place, and time. She appears well-developed and well-nourished. No distress.  HENT:  Head: Normocephalic and atraumatic.  Mouth/Throat: Oropharynx is clear and moist.  Eyes: EOM are normal. Pupils are equal, round, and reactive to light.  Neck: Normal range of motion. Neck supple.  Cardiovascular: Normal rate and regular rhythm.   Pulmonary/Chest: Effort normal and breath sounds normal. No respiratory distress. She has no wheezes. She has no rales.  Abdominal: Soft. Bowel sounds are normal. She exhibits no distension and no mass. There is no tenderness. There is no rebound and no guarding.  Musculoskeletal: Normal range of motion. She exhibits no edema and no tenderness.  Neurological: She is alert and oriented to person, place, and time.  Moves all extremities without deficit, sensation grossly intact. Patient appears mildly drowsy but easily aroused  Skin: Skin is warm and dry. No  rash noted. No erythema.  Psychiatric: She has a normal mood and affect. Her behavior is normal.    ED Course  Procedures (including critical care time) Labs Review Labs Reviewed - No data to display Imaging Review No results found.  MDM  Will have TSS eval for inpatient treatment. She is medical cleared    Loren Racer, MD 08/17/13 [phone]

## 2013-08-16 NOTE — ED Notes (Signed)
At d/c desk: Pt states, "I need to check back in.  I can't do outpt tx.  I need inpt tx".

## 2013-08-16 NOTE — ED Notes (Signed)
Transport arrived to take patient to [facility]. Still in agreement to go. ACT team calling [facility] to let them know she is on the way.

## 2013-08-16 NOTE — Progress Notes (Addendum)
Talked with pt's nurse regarding urine collection on pt so results can be sent to [facility] and [facility] along with the referral, it has been requested per her nurse, pt stated "ok", and rolled back over and went to sleep.  Update: PT. Submitted urine specimen and UDS was completed, upon completion referral was sent to [facility].  Sandy from [facility] called @ 1655 stating that pt had been accepted and they were in the process of completing her authorization and TAR but inform them when she was on her way.  [facility] transportation services was called, spoke with Aurther Loft,  to transport and they stated  ETA to be 1800.  TTS, pt, pt's nurse were notified.  Tomi Bamberger, MHT
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
## 2013-08-16 NOTE — ED Notes (Signed)
Notified BH she is awake. Telepsych set up in room for interview at this time.

## 2013-08-16 NOTE — ED Notes (Signed)
Patient here earlier, wanting detox from heroin.  Patient states she took 1/2 gram earlier, usually takes 1/2 to 1 gram a day.  Patient states that she came back in because she won't do outpatient, she wants inpatient.

## 2013-08-16 NOTE — ED Notes (Signed)
Pt states that her son's birthday is Tuesday and she does not want to go to treatment until Tuesday. Pt agrees to wait for [facility] at this time

## 2013-08-16 NOTE — ED Provider Notes (Signed)
I saw and evaluated the patient, reviewed the resident's note and I agree with the findings and plan.  Heroin abuse.  Reversal with Narcan.  VSS.  Labs negative.  Pt does not have SI/HI and doesn't want inpt detox.  She is d/c to home.  VSS, steady gait, normal MS.    Darlys Gales, MD 08/16/13 [phone]

## 2013-11-12 ENCOUNTER — Emergency Department (HOSPITAL_BASED_OUTPATIENT_CLINIC_OR_DEPARTMENT_OTHER)
Admission: EM | Admit: 2013-11-12 | Discharge: 2013-11-12 | Disposition: A | Discharge status: Home / Self Care | Payer: Self-pay | Attending: Emergency Medicine | Admitting: Emergency Medicine

## 2013-11-12 ENCOUNTER — Encounter (HOSPITAL_BASED_OUTPATIENT_CLINIC_OR_DEPARTMENT_OTHER): Payer: Self-pay | Admitting: Emergency Medicine

## 2013-11-12 DIAGNOSIS — Z8619 Personal history of other infectious and parasitic diseases: Secondary | ICD-10-CM | POA: Insufficient documentation

## 2013-11-12 DIAGNOSIS — G8929 Other chronic pain: Secondary | ICD-10-CM | POA: Insufficient documentation

## 2013-11-12 DIAGNOSIS — M549 Dorsalgia, unspecified: Secondary | ICD-10-CM | POA: Insufficient documentation

## 2013-11-12 DIAGNOSIS — Z79899 Other long term (current) drug therapy: Secondary | ICD-10-CM | POA: Insufficient documentation

## 2013-11-12 DIAGNOSIS — F17200 Nicotine dependence, unspecified, uncomplicated: Secondary | ICD-10-CM | POA: Insufficient documentation

## 2013-11-12 DIAGNOSIS — Z3202 Encounter for pregnancy test, result negative: Secondary | ICD-10-CM | POA: Insufficient documentation

## 2013-11-12 DIAGNOSIS — F411 Generalized anxiety disorder: Secondary | ICD-10-CM | POA: Insufficient documentation

## 2013-11-12 DIAGNOSIS — M79609 Pain in unspecified limb: Secondary | ICD-10-CM | POA: Insufficient documentation

## 2013-11-12 HISTORY — DX: Unspecified viral hepatitis C without hepatic coma: B19.20

## 2013-11-12 LAB — URINALYSIS, ROUTINE W REFLEX MICROSCOPIC
GLUCOSE, UA: NEGATIVE mg/dL
HGB URINE DIPSTICK: NEGATIVE
Ketones, ur: 15 mg/dL — AB
Leukocytes, UA: NEGATIVE
Nitrite: NEGATIVE
Protein, ur: NEGATIVE mg/dL
Specific Gravity, Urine: 1.031 — ABNORMAL HIGH (ref 1.005–1.030)
Urobilinogen, UA: 1 mg/dL (ref 0.0–1.0)
pH: 5.5 (ref 5.0–8.0)

## 2013-11-12 LAB — PREGNANCY, URINE: Preg Test, Ur: NEGATIVE

## 2013-11-12 MED ORDER — CYCLOBENZAPRINE HCL 10 MG PO TABS: 10.0000 mg | ORAL_TABLET | Freq: Two times a day (BID) | ORAL | Status: AC | PRN

## 2013-11-12 MED ORDER — KETOROLAC TROMETHAMINE 60 MG/2ML IM SOLN
60.0000 mg | Freq: Once | INTRAMUSCULAR | Status: AC
  Administered 2013-11-12: 60 mg via INTRAMUSCULAR
  Filled 2013-11-12: qty 2

## 2013-11-12 NOTE — ED Provider Notes (Signed)
Medical screening examination/treatment/procedure(s) were performed by non-physician practitioner and as supervising physician I was immediately available for consultation/collaboration.     Fontaine Kossman, MD 11/12/13 1457 

## 2013-11-12 NOTE — ED Notes (Signed)
Pt c/o back and leg pain for years and sts lifting something heavy yesterday "flared it up". Pt sts she took pain med from a friend for same. Pt sts she is "supposed to be taking Soma and Norco 10" but "I haven't been able to go back to Dr. Stann Mainlandlancy in a year".

## 2013-11-12 NOTE — ED Notes (Signed)
Patient is unable to keep her eyes open while lying in the bed.  Patient also has slurred speech.  Patient admits to taking pain medications from a friend.

## 2013-11-12 NOTE — Discharge Instructions (Signed)

## 2013-11-12 NOTE — ED Provider Notes (Signed)
CSN: [id number]     Arrival date & time 11/12/13  1059 History   First MD Initiated Contact with Patient 11/12/13 1216     Chief Complaint  Patient presents with  . Back Pain  . Leg Pain   (Consider location/radiation/quality/duration/timing/severity/associated sxs/prior Treatment) Patient is a 33 y.o. female presenting with back pain and leg pain. The history is provided by the patient. No language interpreter was used.  Back Pain Location:  Generalized Quality:  Aching Radiates to:  Does not radiate Pain severity:  Moderate Pain is:  Same all the time Onset quality:  Sudden Timing:  Constant Progression:  Partially resolved Chronicity:  New Relieved by:  Nothing Worsened by:  Nothing tried Associated symptoms: leg pain   Risk factors: no hx of cancer   Leg Pain Associated symptoms: back pain     Past Medical History  Diagnosis Date  . Chronic back pain   . Anxiety   . Hepatitis C    Past Surgical History  Procedure Laterality Date  . Tubal ligation    . Tonsillectomy    . Foot surgery     No family history on file. History  Substance Use Topics  . Smoking status: Current Every Day Smoker -- 1.00 packs/day    Types: Cigarettes  . Smokeless tobacco: Not on file  . Alcohol Use: No   OB History   Grav Para Term Preterm Abortions TAB SAB Ect Mult Living                 Review of Systems  Musculoskeletal: Positive for back pain.  All other systems reviewed and are negative.    Allergies  Dilaudid; Aspirin; and Tylenol  Home Medications   Current Outpatient Rx  Name  Route  Sig  Dispense  Refill  . ARIPiprazole (ABILIFY) 10 MG tablet   Oral   Take 10 mg by mouth daily.         . carisoprodol (SOMA) 350 MG tablet   Oral   Take 350 mg by mouth 3 (three) times daily.         Marland Kitchen. HYDROcodone-acetaminophen (NORCO) 10-325 MG per tablet   Oral   Take 1 tablet by mouth every 6 (six) hours.         . Pseudoephedrine-Acetaminophen (SINUS PO)   Oral   Take 1 tablet by mouth daily as needed (for congestion).          BP 112/65  Pulse 92  Temp(Src) 98.7 F (37.1 C) (Oral)  Resp 20  Ht 5\' 2"  (1.575 m)  Wt 135 lb (61.236 kg)  BMI 24.69 kg/m2  SpO2 97% Physical Exam  Nursing note and vitals reviewed. Constitutional: She is oriented to person, place, and time. She appears well-developed and well-nourished.  HENT:  Head: Normocephalic.  Eyes: EOM are normal. Pupils are equal, round, and reactive to light.  Neck: Normal range of motion.  Pulmonary/Chest: Effort normal.  Abdominal: Soft. She exhibits no distension.  Musculoskeletal: Normal range of motion.  Neurological: She is alert and oriented to person, place, and time.  Psychiatric: She has a normal mood and affect.    ED Course  Procedures (including critical care time) Labs Review Labs Reviewed  URINALYSIS, ROUTINE W REFLEX MICROSCOPIC - Abnormal; Notable for the following:    Color, Urine AMBER (*)    APPearance CLOUDY (*)    Specific Gravity, Urine 1.031 (*)    Bilirubin Urine SMALL (*)    Ketones, ur 15 (*)  All other components within normal limits  PREGNANCY, URINE   Imaging Review No results found.  EKG Interpretation   None       MDM   1. Chronic pain     Pt is advised I will not five her oral narcotics.   Pt has a long history of abuse.  Pt has been previously advised we will not treat with narcotics.     Lonia Skinner [place], PA-C 11/12/13 1239

## 2014-05-15 ENCOUNTER — Emergency Department (HOSPITAL_BASED_OUTPATIENT_CLINIC_OR_DEPARTMENT_OTHER)
Admission: EM | Admit: 2014-05-15 | Discharge: 2014-05-15 | Disposition: A | Discharge status: Home / Self Care | Payer: Self-pay | Attending: Emergency Medicine | Admitting: Emergency Medicine

## 2014-05-15 ENCOUNTER — Encounter (HOSPITAL_BASED_OUTPATIENT_CLINIC_OR_DEPARTMENT_OTHER): Payer: Self-pay | Admitting: Emergency Medicine

## 2014-05-15 DIAGNOSIS — IMO0002 Reserved for concepts with insufficient information to code with codable children: Secondary | ICD-10-CM | POA: Insufficient documentation

## 2014-05-15 DIAGNOSIS — Z8619 Personal history of other infectious and parasitic diseases: Secondary | ICD-10-CM | POA: Insufficient documentation

## 2014-05-15 DIAGNOSIS — F17200 Nicotine dependence, unspecified, uncomplicated: Secondary | ICD-10-CM | POA: Insufficient documentation

## 2014-05-15 DIAGNOSIS — L02413 Cutaneous abscess of right upper limb: Secondary | ICD-10-CM

## 2014-05-15 DIAGNOSIS — Z79899 Other long term (current) drug therapy: Secondary | ICD-10-CM | POA: Insufficient documentation

## 2014-05-15 DIAGNOSIS — G8929 Other chronic pain: Secondary | ICD-10-CM | POA: Insufficient documentation

## 2014-05-15 DIAGNOSIS — Z8659 Personal history of other mental and behavioral disorders: Secondary | ICD-10-CM | POA: Insufficient documentation

## 2014-05-15 MED ORDER — SULFAMETHOXAZOLE-TRIMETHOPRIM 800-160 MG PO TABS: 1.0000 | ORAL_TABLET | Freq: Two times a day (BID) | ORAL | Status: DC

## 2014-05-15 MED ORDER — LIDOCAINE-EPINEPHRINE 1 %-1:100000 IJ SOLN
10.0000 mL | Freq: Once | INTRAMUSCULAR | Status: AC
  Administered 2014-05-15: 10 mL
  Filled 2014-05-15: qty 1
  Filled 2014-05-15: qty 10

## 2014-05-15 NOTE — ED Notes (Signed)
Right forearm abscess cleansed with normal saline,  4x4 gauze applied and wrapped with kerlix

## 2014-05-15 NOTE — ED Notes (Addendum)
Patient has an open boil on her right forearm. Pt admits still using heroin, but states the abscess started as a "bump"

## 2014-05-15 NOTE — Discharge Instructions (Signed)

## 2014-05-15 NOTE — ED Provider Notes (Signed)
CSN: [id number]     Arrival date & time 05/15/14  1601 History  This chart was scribed for Audree CamelScott T Tanesha Arambula, MD by Modena JanskyAlbert Thayil, ED Scribe. This patient was seen in room MH04/MH04 and the patient's care was started at 5:54 PM.      Chief Complaint  Patient presents with  . Abscess    The history is provided by the patient. No language interpreter was used.   HPI Comments: Bonnell Publicmanda N Cassels is a 33 y.o. female who presents to the Emergency Department complaining of an abscess on her right forearm that started about 5 days ago. She states that the abscess started off as a bump that got larger. She reports that pus and blood has been coming out of her abscess and that there is some erythema around the abscess. She report no known allergies. She denies any fever. She has a history of heroin abuse, last used 1 mo ago. Uses on ventral side of forearm/elbow, not the dorsal aspect where the abscess is.   Past Medical History  Diagnosis Date  . Chronic back pain   . Anxiety   . Hepatitis C    Past Surgical History  Procedure Laterality Date  . Tubal ligation    . Tonsillectomy    . Foot surgery     No family history on file. History  Substance Use Topics  . Smoking status: Current Every Day Smoker -- 1.00 packs/day    Types: Cigarettes  . Smokeless tobacco: Not on file  . Alcohol Use: No   OB History   Grav Para Term Preterm Abortions TAB SAB Ect Mult Living                 Review of Systems  Constitutional: Negative for fever.  Skin: Positive for color change and wound.  All other systems reviewed and are negative.     Allergies  Dilaudid; Aspirin; and Tylenol  Home Medications   Prior to Admission medications   Medication Sig Start Date End Date Taking? Authorizing Provider  ARIPiprazole (ABILIFY) 10 MG tablet Take 10 mg by mouth daily.    Historical Provider, MD  carisoprodol (SOMA) 350 MG tablet Take 350 mg by mouth 3 (three) times daily.    Historical Provider, MD   cyclobenzaprine (FLEXERIL) 10 MG tablet Take 1 tablet (10 mg total) by mouth 2 (two) times daily as needed for muscle spasms. 11/12/13   Elson AreasLeslie K Sofia, PA-C  HYDROcodone-acetaminophen [facility](NORCO) 10-325 MG per tablet Take 1 tablet by mouth every 6 (six) hours.    Historical Provider, MD  Pseudoephedrine-Acetaminophen (SINUS PO) Take 1 tablet by mouth daily as needed (for congestion).    Historical Provider, MD   There were no vitals taken for this visit. Physical Exam  Nursing note and vitals reviewed. Constitutional: She is oriented to person, place, and time. She appears well-developed and well-nourished. No distress.  HENT:  Head: Normocephalic and atraumatic.  Right Ear: External ear normal.  Left Ear: External ear normal.  Neck: Neck supple. No tracheal deviation present.  Cardiovascular: Normal rate, regular rhythm, normal heart sounds and intact distal pulses.   Pulmonary/Chest: Effort normal.  Abdominal: She exhibits no distension.  Musculoskeletal: Normal range of motion.       Arms: Neurological: She is alert and oriented to person, place, and time.  Skin: Skin is warm and dry.  Psychiatric: She has a normal mood and affect. Her behavior is normal.    ED Course  INCISION AND  DRAINAGE Date/Time: 05/15/2014 7:05 PM Performed by: Pricilla LovelessGOLDSTON, Katrina Daddona T Authorized by: Pricilla LovelessGOLDSTON, Acea Yagi T Consent: Verbal consent obtained. Risks and benefits: risks, benefits and alternatives were discussed Type: abscess Body area: upper extremity Location details: right arm Local anesthetic: lidocaine 2% with epinephrine Anesthetic total: 5 ml Patient sedated: no Scalpel size: 11 Incision type: elliptical Complexity: simple Drainage: bloody Drainage amount: scant Wound treatment: wound left open Patient tolerance: Patient tolerated the procedure well with no immediate complications.    COORDINATION OF CARE: 5:59 PM- Pt advised of plan for treatment which includes medication and I&D and pt  agrees.  Labs Review Labs Reviewed - No data to display  Imaging Review No results found.   EKG Interpretation None      MDM   Final diagnoses:  Abscess of right forearm    Patient with superficial abscess. Bedside ultrasound shows it is superficial with some irregular collection of fluid and inflammation. I&D shows only mild-moderate return of bloody fluid. Likely she has drained most of it at home and still has inflammatory response and likely surrounding cellulitis (small amount). Given her hx of drug abuse and purulent cellulitis, will treat with bactrim. Advised of return precautions.  I personally performed the services described in this documentation, which was scribed in my presence. The recorded information has been reviewed and is accurate.     Audree CamelScott T Sherral Dirocco, MD 05/15/14 2059

## 2019-01-08 ENCOUNTER — Emergency Department (HOSPITAL_BASED_OUTPATIENT_CLINIC_OR_DEPARTMENT_OTHER)
Admission: EM | Admit: 2019-01-08 | Discharge: 2019-01-08 | Disposition: A | Discharge status: Home / Self Care | Payer: Medicaid Other | Attending: Emergency Medicine | Admitting: Emergency Medicine

## 2019-01-08 ENCOUNTER — Other Ambulatory Visit: Payer: Self-pay

## 2019-01-08 ENCOUNTER — Encounter (HOSPITAL_BASED_OUTPATIENT_CLINIC_OR_DEPARTMENT_OTHER): Payer: Self-pay | Admitting: *Deleted

## 2019-01-08 DIAGNOSIS — L0291 Cutaneous abscess, unspecified: Secondary | ICD-10-CM

## 2019-01-08 DIAGNOSIS — F17210 Nicotine dependence, cigarettes, uncomplicated: Secondary | ICD-10-CM | POA: Insufficient documentation

## 2019-01-08 DIAGNOSIS — F1410 Cocaine abuse, uncomplicated: Secondary | ICD-10-CM | POA: Insufficient documentation

## 2019-01-08 DIAGNOSIS — L02411 Cutaneous abscess of right axilla: Secondary | ICD-10-CM | POA: Diagnosis not present

## 2019-01-08 HISTORY — DX: Opioid abuse, uncomplicated: F11.10

## 2019-01-08 MED ORDER — SULFAMETHOXAZOLE-TRIMETHOPRIM 800-160 MG PO TABS
1.0000 | ORAL_TABLET | Freq: Once | ORAL | Status: AC
  Administered 2019-01-08: 1 via ORAL
  Filled 2019-01-08: qty 1

## 2019-01-08 MED ORDER — SULFAMETHOXAZOLE-TRIMETHOPRIM 800-160 MG PO TABS: 1.0000 | ORAL_TABLET | Freq: Two times a day (BID) | ORAL | 0 refills | Status: AC

## 2019-01-08 MED ORDER — HYDROCODONE-ACETAMINOPHEN 5-325 MG PO TABS: 1.0000 | ORAL_TABLET | Freq: Four times a day (QID) | ORAL | 0 refills | Status: AC | PRN

## 2019-01-08 MED ORDER — SULFAMETHOXAZOLE-TRIMETHOPRIM 800-160 MG PO TABS: 1.0000 | ORAL_TABLET | Freq: Two times a day (BID) | ORAL | 0 refills | Status: DC

## 2019-01-08 MED ORDER — HYDROCODONE-ACETAMINOPHEN 5-325 MG PO TABS: 1.0000 | ORAL_TABLET | Freq: Once | ORAL | Status: DC

## 2019-01-08 MED ORDER — LIDOCAINE-EPINEPHRINE 1 %-1:100000 IJ SOLN
20.0000 mL | Freq: Once | INTRAMUSCULAR | Status: AC
  Administered 2019-01-08: 20 mL via INTRADERMAL
  Filled 2019-01-08: qty 1

## 2019-01-08 NOTE — ED Triage Notes (Signed)
Pt c/o right abscess to right axillary x 2 weeks

## 2019-01-08 NOTE — ED Provider Notes (Signed)
Emergency Department Provider Note   I have reviewed the triage vital signs and the nursing notes.   HISTORY  Chief Complaint No chief complaint on file.   HPI Bridget Morgan is a 38 y.o. female with a history of abscesses and IV drug use but does not use IV drugs anymore the presents emergency department today with swelling, drainage and pain to her right axilla.  Patient states is been there for about 2 weeks is seen her primary doctor who popped it and it did drain a little bit but the area seems to be more painful and not improving like she was should be on clindamycin.  Patient states that she is tried warm compresses, naproxen, Tylenol and ibuprofen without relief.  She is here requesting incision and drainage. No other associated or modifying symptoms.    Past Medical History:  Diagnosis Date  . Anxiety   . Chronic back pain   . Hepatitis C   . Opiate abuse, continuous (HCC)     There are no active problems to display for this patient.   Past Surgical History:  Procedure Laterality Date  . FOOT SURGERY    . TONSILLECTOMY    . TUBAL LIGATION      Current Outpatient Rx  . Order #: [id number] Class: Print  . Order #: [id number] Class: Print  . Order #: [id number] Class: Historical Med  . Order #: [id number] Class: Print    Allergies Dilaudid [hydromorphone hcl]; Aspirin; and Tylenol [acetaminophen]  History reviewed. No pertinent family history.  Social History Social History   Tobacco Use  . Smoking status: Current Every Day Smoker    Packs/day: 0.50    Types: Cigarettes  . Smokeless tobacco: Never Used  Substance Use Topics  . Alcohol use: No  . Drug use: Yes    Types: Cocaine    Comment: Heroin    Review of Systems  All other systems negative except as documented in the HPI. All pertinent positives and negatives as reviewed in the HPI. ____________________________________________   PHYSICAL EXAM:  VITAL SIGNS: ED Triage Vitals [01/08/19 1503]    Enc Vitals Group     BP (!) 140/100     Pulse Rate 100     Resp 18     Temp 98.3 F (36.8 C)     Temp Source Oral     SpO2 99 %     Weight 177 lb (80.3 kg)     Height 5\' 2"  (1.575 m)     Head Circumference      Peak Flow      Pain Score 8     Pain Loc      Pain Edu?      Excl. in GC?     Constitutional: Alert and oriented. Well appearing and in no acute distress. Eyes: Conjunctivae are normal. PERRL. EOMI. Head: Atraumatic. Nose: No congestion/rhinnorhea. Mouth/Throat: Mucous membranes are moist.  Oropharynx non-erythematous. Neck: No stridor.  No meningeal signs.   Cardiovascular: Normal rate, regular rhythm. Good peripheral circulation. Grossly normal heart sounds.   Respiratory: Normal respiratory effort.  No retractions. Lungs CTAB. Gastrointestinal: Soft and nontender. No distention.  Musculoskeletal: No lower extremity tenderness nor edema. No gross deformities of extremities. Neurologic:  Normal speech and language. No gross focal neurologic deficits are appreciated.  Skin:  Skin is warm, dry and intact. No rash noted.  Approximately 1 x 3 cm indurated area in her right axilla.  No obvious fluctuance.  Some surrounding erythema and significant tenderness.  Minimal warmth.   ____________________________________________   LABS (all labs ordered are listed, but only abnormal results are displayed)  Labs Reviewed - No data to display ____________________________________________  EKG   ____________________________________________  RADIOLOGY  No results found.  ____________________________________________   PROCEDURES  Procedure(s) performed:   Ultrasound ED Soft Tissue Date/Time: 01/08/2019 4:08 PM Performed by: Marily Memos, MD Authorized by: Marily Memos, MD   Procedure details:    Indications: localization of abscess and evaluate for cellulitis     Transverse view:  Visualized   Longitudinal view:  Visualized   Images: archived      Limitations:  Body habitus Location:    Location: axilla   Findings:     no abscess present    cellulitis present .Marland KitchenIncision and Drainage Date/Time: 01/08/2019 4:09 PM Performed by: Marily Memos, MD Authorized by: Marily Memos, MD   Consent:    Consent obtained:  Verbal   Consent given by:  Patient   Risks discussed:  Bleeding, incomplete drainage, infection and pain   Alternatives discussed:  No treatment, delayed treatment, alternative treatment, observation and referral Location:    Type:  Abscess   Size:  1x3 cm   Location:  Upper extremity   Upper extremity location: right axilla. Pre-procedure details:    Skin preparation:  Betadine Anesthesia (see MAR for exact dosages):    Anesthesia method:  Local infiltration   Local anesthetic:  Lidocaine 1% WITH epi Procedure type:    Complexity:  Simple Procedure details:    Needle aspiration: no     Incision types:  Single straight   Incision depth:  Submucosal   Scalpel blade:  11   Wound management:  Probed and deloculated and irrigated with saline   Drainage:  Bloody   Drainage amount:  Scant   Wound treatment:  Wound left open   Packing materials:  None Post-procedure details:    Patient tolerance of procedure:  Tolerated well, no immediate complications     ____________________________________________   INITIAL IMPRESSION / ASSESSMENT AND PLAN / ED COURSE  After the ultrasound showed only cellulitis I discussed with the patient however she insisted that there was persistent drainage and felt like she wanted to opened up further.  I tried talking her out of it however ultimately I felt that if anything would help it drain better when it did start draining again.  I indeed without any significant drainage.  We will switch antibiotics and have her follow-up within 3 days to ensure that improving.   Pertinent labs & imaging results that were available during my care of the patient were reviewed by me and considered  in my medical decision making (see chart for details).  ____________________________________________  FINAL CLINICAL IMPRESSION(S) / ED DIAGNOSES  Final diagnoses:  Abscess     MEDICATIONS GIVEN DURING THIS VISIT:  Medications  HYDROcodone-acetaminophen (NORCO/VICODIN) 5-325 MG per tablet 1 tablet (has no administration in time range)  lidocaine-EPINEPHrine (XYLOCAINE W/EPI) 1 %-1:100000 (with pres) injection 20 mL (20 mLs Intradermal Given 01/08/19 1537)  sulfamethoxazole-trimethoprim (BACTRIM DS,SEPTRA DS) 800-160 MG per tablet 1 tablet (1 tablet Oral Given 01/08/19 1537)     NEW OUTPATIENT MEDICATIONS STARTED DURING THIS VISIT:  New Prescriptions   HYDROCODONE-ACETAMINOPHEN (NORCO/VICODIN) 5-325 MG TABLET    Take 1 tablet by mouth every 6 (six) hours as needed for severe pain.   SULFAMETHOXAZOLE-TRIMETHOPRIM (BACTRIM DS,SEPTRA DS) 800-160 MG TABLET    Take 1 tablet by mouth 2 (two) times daily for 7 days.  Note:  This note was prepared with assistance of Dragon voice recognition software. Occasional wrong-word or sound-a-like substitutions may have occurred due to the inherent limitations of voice recognition software.   Marily Memos, MD 01/08/19 [phone]

## 2020-01-06 ENCOUNTER — Encounter (HOSPITAL_BASED_OUTPATIENT_CLINIC_OR_DEPARTMENT_OTHER): Payer: Self-pay | Admitting: Emergency Medicine

## 2020-01-06 ENCOUNTER — Emergency Department (HOSPITAL_BASED_OUTPATIENT_CLINIC_OR_DEPARTMENT_OTHER): Payer: Medicaid Other

## 2020-01-06 ENCOUNTER — Emergency Department (HOSPITAL_BASED_OUTPATIENT_CLINIC_OR_DEPARTMENT_OTHER)
Admission: EM | Admit: 2020-01-06 | Discharge: 2020-01-06 | Disposition: A | Discharge status: Home / Self Care | Payer: Medicaid Other | Attending: Emergency Medicine | Admitting: Emergency Medicine

## 2020-01-06 ENCOUNTER — Other Ambulatory Visit: Payer: Self-pay

## 2020-01-06 DIAGNOSIS — F419 Anxiety disorder, unspecified: Secondary | ICD-10-CM | POA: Insufficient documentation

## 2020-01-06 DIAGNOSIS — S82845P Nondisplaced bimalleolar fracture of left lower leg, subsequent encounter for closed fracture with malunion: Secondary | ICD-10-CM | POA: Diagnosis not present

## 2020-01-06 DIAGNOSIS — Z760 Encounter for issue of repeat prescription: Secondary | ICD-10-CM | POA: Diagnosis not present

## 2020-01-06 DIAGNOSIS — Z79899 Other long term (current) drug therapy: Secondary | ICD-10-CM | POA: Diagnosis not present

## 2020-01-06 DIAGNOSIS — M25572 Pain in left ankle and joints of left foot: Secondary | ICD-10-CM | POA: Diagnosis not present

## 2020-01-06 DIAGNOSIS — F17210 Nicotine dependence, cigarettes, uncomplicated: Secondary | ICD-10-CM | POA: Insufficient documentation

## 2020-01-06 DIAGNOSIS — S82842P Displaced bimalleolar fracture of left lower leg, subsequent encounter for closed fracture with malunion: Secondary | ICD-10-CM

## 2020-01-06 DIAGNOSIS — H61892 Other specified disorders of left external ear: Secondary | ICD-10-CM | POA: Diagnosis not present

## 2020-01-06 DIAGNOSIS — W010XXD Fall on same level from slipping, tripping and stumbling without subsequent striking against object, subsequent encounter: Secondary | ICD-10-CM | POA: Diagnosis not present

## 2020-01-06 DIAGNOSIS — S99912D Unspecified injury of left ankle, subsequent encounter: Secondary | ICD-10-CM | POA: Diagnosis present

## 2020-01-06 MED ORDER — IBUPROFEN 800 MG PO TABS
800.0000 mg | ORAL_TABLET | Freq: Once | ORAL | Status: AC
  Administered 2020-01-06: 800 mg via ORAL
  Filled 2020-01-06: qty 1

## 2020-01-06 MED ORDER — DOXYCYCLINE HYCLATE 100 MG PO CAPS: 100.0000 mg | ORAL_CAPSULE | Freq: Two times a day (BID) | ORAL | 0 refills | Status: AC

## 2020-01-06 MED ORDER — MUPIROCIN CALCIUM 2 % EX CREA: 1.0000 "application " | TOPICAL_CREAM | Freq: Two times a day (BID) | CUTANEOUS | 0 refills | Status: AC

## 2020-01-06 MED ORDER — DIAZEPAM 5 MG PO TABS
5.0000 mg | ORAL_TABLET | Freq: Once | ORAL | Status: AC
  Administered 2020-01-06: 5 mg via ORAL
  Filled 2020-01-06: qty 1

## 2020-01-06 NOTE — ED Provider Notes (Signed)
[facility] EMERGENCY DEPARTMENT Provider Note   CSN: [id number] Arrival date & time: 01/06/20  1208     History Chief Complaint  Patient presents with  . Ankle Injury  . Medication Refill  . Wound Check    Bridget Morgan is a 39 y.o. female.  39 yo F with chief complaints of left ankle pain.  Patient states that she had tripped over her cat and is having pain to the left ankle.  She unfortunately has a complicated history of that ankle where it became infected requiring removal of hardware and IV antibiotics in the hospital.  Her last discussion with orthopedic surgeon was there was no further operative intervention and that she should remain nonweightbearing indefinitely.  Patient is upset about this and would like referral to a different orthopedic surgeon for second opinion.  She also is complaining about increased anxiety she has apparently run out of her benzodiazepines and is looking for a refill of her prescription.  She also has been rubbing on her left earlobe and thinks it may be infected and is looking for antibiotics.  The history is provided by the patient.  Ankle Injury Pertinent negatives include no chest pain, no headaches and no shortness of breath.  Medication Refill Wound Check Pertinent negatives include no chest pain, no headaches and no shortness of breath.  Illness Severity:  Moderate Onset quality:  Gradual Duration:  2 weeks Timing:  Constant Progression:  Worsening Chronicity:  New Associated symptoms: no chest pain, no congestion, no fever, no headaches, no myalgias, no nausea, no rhinorrhea, no shortness of breath, no vomiting and no wheezing        Past Medical History:  Diagnosis Date  . Anxiety   . Chronic back pain   . Hepatitis C   . Opiate abuse, continuous ([place])     There are no problems to display for this patient.   Past Surgical History:  Procedure Laterality Date  . FOOT SURGERY    . TONSILLECTOMY    . TUBAL  LIGATION       OB History   No obstetric history on file.     History reviewed. No pertinent family history.  Social History   Tobacco Use  . Smoking status: Current Every Day Smoker    Packs/day: 0.50    Types: Cigarettes  . Smokeless tobacco: Never Used  Substance Use Topics  . Alcohol use: No  . Drug use: Yes    Types: Cocaine    Comment: Heroin    Home Medications Prior to Admission medications   Medication Sig Start Date End Date Taking? Authorizing Provider  cyclobenzaprine (FLEXERIL) 10 MG tablet Take 1 tablet (10 mg total) by mouth 2 (two) times daily as needed for muscle spasms. 11/12/13   Fransico Meadow, PA-C  doxycycline (VIBRAMYCIN) 100 MG capsule Take 1 capsule (100 mg total) by mouth 2 (two) times daily. One po bid x 7 days 01/06/20   Deno Etienne, DO  HYDROcodone-acetaminophen (NORCO/VICODIN) 5-325 MG tablet Take 1 tablet by mouth every 6 (six) hours as needed for severe pain. 01/08/19   Mesner, Corene Cornea, MD  mupirocin cream (BACTROBAN) 2 % Apply 1 application topically 2 (two) times daily. 01/06/20   Deno Etienne, DO  Pseudoephedrine-Acetaminophen (SINUS PO) Take 1 tablet by mouth daily as needed (for congestion).    [provider]    Allergies    Dilaudid [hydromorphone hcl], Aspirin, and Tylenol [acetaminophen]  Review of Systems   Review of  Systems  Constitutional: Negative for chills and fever.  HENT: Negative for congestion and rhinorrhea.   Eyes: Negative for redness and visual disturbance.  Respiratory: Negative for shortness of breath and wheezing.   Cardiovascular: Negative for chest pain and palpitations.  Gastrointestinal: Negative for nausea and vomiting.  Genitourinary: Negative for dysuria and urgency.  Musculoskeletal: Positive for arthralgias and gait problem. Negative for myalgias.  Skin: Positive for wound. Negative for pallor.  Neurological: Negative for dizziness and headaches.    Physical Exam Updated Vital Signs BP (!) 123/97  (BP Location: Right Arm)   Pulse (!) 104   Temp 98.3 F (36.8 C) (Oral)   Resp 10   SpO2 92%   Physical Exam Vitals and nursing note reviewed.  Constitutional:      General: She is not in acute distress.    Appearance: She is well-developed. She is not diaphoretic.  HENT:     Head: Normocephalic and atraumatic.     Comments: Left ear has an excoriated area with honey colored crust and mild edema.  No induration or fluctuance. Eyes:     Pupils: Pupils are equal, round, and reactive to light.  Cardiovascular:     Rate and Rhythm: Normal rate and regular rhythm.     Heart sounds: No murmur. No friction rub. No gallop.   Pulmonary:     Effort: Pulmonary effort is normal.     Breath sounds: No wheezing or rales.  Abdominal:     General: There is no distension.     Palpations: Abdomen is soft.     Tenderness: There is no abdominal tenderness.  Musculoskeletal:        General: Deformity present. No tenderness.     Cervical back: Normal range of motion and neck supple.     Comments: Deformity noted to the left lower extremity about the distal tibia.  Pulse motor and sensation are intact distally.  Skin:    General: Skin is warm and dry.     Comments: Diffuse areas of excoriation  Neurological:     Mental Status: She is alert and oriented to person, place, and time.  Psychiatric:        Behavior: Behavior normal.     ED Results / Procedures / Treatments   Labs (all labs ordered are listed, but only abnormal results are displayed) Labs Reviewed - No data to display  EKG None  Radiology DG Ankle Complete Left  Result Date: 01/06/2020 CLINICAL DATA:  Pain following fall EXAM: LEFT ANKLE COMPLETE - 3+ VIEW COMPARISON:  CT left ankle July 09, 2019 FINDINGS: Frontal, oblique, and lateral views were obtained. There is evidence of a nonunited prior fracture in the medial malleolar region with several bony fragments in this area. There is evidence of a prior fracture of the distal  fibula which has undergone bony and fibrous union compared to the previous CT examination. 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 There is no acute appearing fracture. No appreciable joint effusion. There is soft tissue swelling. There is a screw in the proximal first metatarsal. Ankle mortise shows no obvious instability. Foci of intra-articular calcification are felt to be of posttraumatic etiology. IMPRESSION: Evidence of prior trauma with nonunited medial malleolar fracture and fragmentation in this area. There is evidence of a prior fracture of the distal fibula which is undergone bony in fibrous union compared to the previous CT. 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 Screw present and proximal first metatarsal. No acute fracture or gross ankle mortise instability noted. Foci of intra-articular calcification consistent with prior trauma noted. Electronically Signed   By: Bretta Bang III M.D.   On: 01/06/2020 14:14    Procedures Procedures (including critical care time)  Medications Ordered in ED Medications  diazepam (VALIUM) tablet 5 mg (5 mg Oral Given 01/06/20 1330)  ibuprofen (ADVIL) tablet 800 mg (800 mg Oral Given 01/06/20 1330)    ED Course  I have reviewed the triage vital signs and the nursing notes.  Pertinent labs & imaging results that were available during my care of the patient were reviewed by me and considered in my medical decision making (see chart for details).    MDM Rules/Calculators/A&P                      39 yo F with a chief complaints of left ankle pain.  The patient states that she is to be nonweightbearing indefinitely and she had tripped over her cat and had worsening pain there.  On exam the patient has deformity and a unstable left ankle.  Leading back at her old notes apparently she had  been doing opiates and cocaine and had been weightbearing immediately after a orthopedic repair of trimalleolar fracture which resulted in dehiscence of her wound since loosening of the hardware and subsequent hardware infection.  These were removed in the hospital and she was started on IV antibiotics with the idea that she would be externally fixated but the patient left AGAINST MEDICAL ADVICE.  After which she was fired from the orthopedic practice for "dishonesty ".  Plan that she has late ounces to be nonweightbearing indefinitely though there apparently were some talks about amputation.  She would like to see a different orthopedist for this if possible.  Patient is also complaining of possible infection to the left earlobe.  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  It does have honey colored crust and so will start on mupirocin ointment.  She does have diffuse lesions that are likely due to her mental illness, will give her a short course of doxycycline.  Patient is also complaining of worsening anxiety and running out of her chronic benzodiazepines.  I discussed with her that we do not represcribe these medicines for her that they are to be prescribed by her family doctor or psychiatrist.  We will give her 1 dose here.  X-ray viewed by me with chronic appearing fracture.  Read by radiology is the same.  We will have her follow-up with her orthopedist, given our orthopedist to call and see if they are willing to see her for second opinion.  2:26 PM:  I have discussed the diagnosis/risks/treatment options with the patient and believe the pt to be eligible for discharge home to follow-up with Ortho, PCP. We also discussed returning to the ED immediately if new or worsening sx occur. We discussed the sx which are most concerning (e.g., sudden worsening pain, fever, inability to tolerate by mouth) that necessitate immediate return.  Medications administered to the patient during their visit and any new prescriptions provided to the patient are listed below.  Medications given during this visit Medications  diazepam (VALIUM) tablet 5 mg (5 mg Oral Given 01/06/20 1330)  ibuprofen (ADVIL) tablet 800 mg (800 mg Oral Given 01/06/20 1330)  The patient appears reasonably screen and/or stabilized for discharge and I doubt any other medical condition or other [facility] requiring further screening, evaluation, or treatment in the ED at this time prior to discharge.   Final Clinical Impression(s) / ED Diagnoses Final diagnoses:  Ankle fracture, bimalleolar, closed, left, with malunion, subsequent encounter    Rx / DC Orders ED Discharge Orders         Ordered    mupirocin cream (BACTROBAN) 2 %  2 times daily     01/06/20 1420    doxycycline (VIBRAMYCIN) 100 MG capsule  2 times daily     01/06/20 1420           [place], DO 01/06/20 1426

## 2020-01-06 NOTE — ED Triage Notes (Signed)
Pt here with anxiety and picking of wound on left ear lobe. Concerned that she has infection and that she might have reinjured her left ankle after tripping over cat. Presents as very drowsy and states that she took a percocet this am.

## 2020-01-06 NOTE — Discharge Instructions (Signed)
There is no obvious acute fracture to your left ankle.  Please continue the instructions that were given to you by your orthopedic doctor.  I have provided the orthopedic doctor that we have on call you can call them and see if they are willing to see you for a second opinion.  Please follow-up with your psychiatrist for your mental illness.  They should be the ones that prescribed you your Klonopin if they feel that you need it.  I have prescribed you an antibiotic as well as a topical antibiotic for your ear.  Please try to refrain from touching it.

## 2020-10-14 ENCOUNTER — Encounter (HOSPITAL_BASED_OUTPATIENT_CLINIC_OR_DEPARTMENT_OTHER): Payer: Self-pay

## 2020-10-14 ENCOUNTER — Emergency Department (HOSPITAL_BASED_OUTPATIENT_CLINIC_OR_DEPARTMENT_OTHER)
Admission: EM | Admit: 2020-10-14 | Discharge: 2020-10-14 | Disposition: A | Discharge status: Home / Self Care | Payer: Medicaid Other | Attending: Emergency Medicine | Admitting: Emergency Medicine

## 2020-10-14 ENCOUNTER — Other Ambulatory Visit: Payer: Self-pay

## 2020-10-14 DIAGNOSIS — R2689 Other abnormalities of gait and mobility: Secondary | ICD-10-CM | POA: Insufficient documentation

## 2020-10-14 DIAGNOSIS — J3489 Other specified disorders of nose and nasal sinuses: Secondary | ICD-10-CM | POA: Insufficient documentation

## 2020-10-14 DIAGNOSIS — R4583 Excessive crying of child, adolescent or adult: Secondary | ICD-10-CM | POA: Insufficient documentation

## 2020-10-14 DIAGNOSIS — Z5321 Procedure and treatment not carried out due to patient leaving prior to being seen by health care provider: Secondary | ICD-10-CM | POA: Insufficient documentation

## 2020-10-14 DIAGNOSIS — R4781 Slurred speech: Secondary | ICD-10-CM | POA: Insufficient documentation

## 2020-10-14 DIAGNOSIS — L02611 Cutaneous abscess of right foot: Secondary | ICD-10-CM | POA: Diagnosis not present

## 2020-10-14 DIAGNOSIS — H00031 Abscess of right upper eyelid: Secondary | ICD-10-CM | POA: Insufficient documentation

## 2020-10-14 NOTE — ED Triage Notes (Addendum)
Pt c/o abscess above right eye x 1 week, abscess to right foot x 3 days-pt with slow gait/slurred speech/crying-states she had "snorted something from somebody for pain" PTA-no resp distress

## 2020-10-14 NOTE — ED Notes (Signed)
Pt was taken to ED WR via w/c-advised to not get out of chair without assist-states she was dropped off-asked if she could outside to smoke-advised she could not due to fall risk and no smoking policy
